# Patient Record
Sex: Female | Born: 1955 | Race: White | Hispanic: No | Smoking: Former smoker
Health system: Southern US, Community
[De-identification: ages and names within clinical notes are randomized; demographics above are authoritative.]

## PROBLEM LIST (undated history)

## (undated) DIAGNOSIS — I10 Essential (primary) hypertension: Secondary | ICD-10-CM

## (undated) DIAGNOSIS — M1990 Unspecified osteoarthritis, unspecified site: Secondary | ICD-10-CM

## (undated) DIAGNOSIS — J449 Chronic obstructive pulmonary disease, unspecified: Secondary | ICD-10-CM

## (undated) HISTORY — PX: ABDOMINAL HYSTERECTOMY: SHX81

## (undated) HISTORY — PX: LEEP: SHX91

---

## 2013-12-14 ENCOUNTER — Other Ambulatory Visit (HOSPITAL_COMMUNITY): Payer: Self-pay | Admitting: Family Medicine

## 2013-12-14 ENCOUNTER — Ambulatory Visit (HOSPITAL_COMMUNITY)
Admission: RE | Admit: 2013-12-14 | Discharge: 2013-12-14 | Disposition: A | Discharge status: Home / Self Care | Payer: 59 | Source: Ambulatory Visit | Attending: Family Medicine | Admitting: Family Medicine

## 2013-12-14 DIAGNOSIS — J3489 Other specified disorders of nose and nasal sinuses: Secondary | ICD-10-CM | POA: Insufficient documentation

## 2013-12-14 DIAGNOSIS — R509 Fever, unspecified: Secondary | ICD-10-CM | POA: Insufficient documentation

## 2013-12-14 DIAGNOSIS — R059 Cough, unspecified: Secondary | ICD-10-CM | POA: Insufficient documentation

## 2013-12-14 DIAGNOSIS — J209 Acute bronchitis, unspecified: Secondary | ICD-10-CM

## 2013-12-14 DIAGNOSIS — R05 Cough: Secondary | ICD-10-CM | POA: Insufficient documentation

## 2013-12-14 DIAGNOSIS — F17200 Nicotine dependence, unspecified, uncomplicated: Secondary | ICD-10-CM | POA: Insufficient documentation

## 2013-12-18 ENCOUNTER — Emergency Department (HOSPITAL_COMMUNITY): Payer: 59

## 2013-12-18 ENCOUNTER — Encounter (HOSPITAL_COMMUNITY): Payer: Self-pay | Admitting: Emergency Medicine

## 2013-12-18 ENCOUNTER — Inpatient Hospital Stay (HOSPITAL_COMMUNITY)
Admission: EM | Admit: 2013-12-18 | Discharge: 2013-12-20 | DRG: 190 | Disposition: A | Discharge status: Home / Self Care | Payer: 59 | Attending: Internal Medicine | Admitting: Internal Medicine

## 2013-12-18 DIAGNOSIS — J9600 Acute respiratory failure, unspecified whether with hypoxia or hypercapnia: Secondary | ICD-10-CM | POA: Diagnosis present

## 2013-12-18 DIAGNOSIS — J441 Chronic obstructive pulmonary disease with (acute) exacerbation: Principal | ICD-10-CM | POA: Diagnosis present

## 2013-12-18 DIAGNOSIS — Z23 Encounter for immunization: Secondary | ICD-10-CM

## 2013-12-18 DIAGNOSIS — I10 Essential (primary) hypertension: Secondary | ICD-10-CM | POA: Diagnosis present

## 2013-12-18 DIAGNOSIS — Z681 Body mass index (BMI) 19 or less, adult: Secondary | ICD-10-CM

## 2013-12-18 DIAGNOSIS — E43 Unspecified severe protein-calorie malnutrition: Secondary | ICD-10-CM | POA: Diagnosis present

## 2013-12-18 DIAGNOSIS — J449 Chronic obstructive pulmonary disease, unspecified: Secondary | ICD-10-CM | POA: Diagnosis present

## 2013-12-18 DIAGNOSIS — R0902 Hypoxemia: Secondary | ICD-10-CM

## 2013-12-18 DIAGNOSIS — F17200 Nicotine dependence, unspecified, uncomplicated: Secondary | ICD-10-CM | POA: Diagnosis present

## 2013-12-18 HISTORY — DX: Chronic obstructive pulmonary disease, unspecified: J44.9

## 2013-12-18 HISTORY — DX: Essential (primary) hypertension: I10

## 2013-12-18 LAB — CBC WITH DIFFERENTIAL/PLATELET
Basophils Absolute: 0 10*3/uL (ref 0.0–0.1)
Basophils Relative: 0 % (ref 0–1)
EOS ABS: 0.2 10*3/uL (ref 0.0–0.7)
Eosinophils Relative: 3 % (ref 0–5)
HEMATOCRIT: 43.6 % (ref 36.0–46.0)
Hemoglobin: 13.9 g/dL (ref 12.0–15.0)
LYMPHS PCT: 19 % (ref 12–46)
Lymphs Abs: 1 10*3/uL (ref 0.7–4.0)
MCH: 30.8 pg (ref 26.0–34.0)
MCHC: 31.9 g/dL (ref 30.0–36.0)
MCV: 96.5 fL (ref 78.0–100.0)
MONO ABS: 0.4 10*3/uL (ref 0.1–1.0)
Monocytes Relative: 7 % (ref 3–12)
Neutro Abs: 3.8 10*3/uL (ref 1.7–7.7)
Neutrophils Relative %: 71 % (ref 43–77)
PLATELETS: 360 10*3/uL (ref 150–400)
RBC: 4.52 MIL/uL (ref 3.87–5.11)
RDW: 13.1 % (ref 11.5–15.5)
WBC: 5.3 10*3/uL (ref 4.0–10.5)

## 2013-12-18 LAB — BASIC METABOLIC PANEL
BUN: 10 mg/dL (ref 6–23)
CALCIUM: 10.8 mg/dL — AB (ref 8.4–10.5)
CHLORIDE: 97 meq/L (ref 96–112)
CO2: 33 meq/L — AB (ref 19–32)
Creatinine, Ser: 0.48 mg/dL — ABNORMAL LOW (ref 0.50–1.10)
GFR calc Af Amer: >90 mL/min (ref >90)
GFR calc non Af Amer: >90 mL/min (ref >90)
Glucose, Bld: 105 mg/dL — ABNORMAL HIGH (ref 70–99)
Potassium: 4.8 mEq/L (ref 3.7–5.3)
Sodium: 142 mEq/L (ref 137–147)

## 2013-12-18 LAB — PRO B NATRIURETIC PEPTIDE: PRO B NATRI PEPTIDE: 119.1 pg/mL (ref 0–125)

## 2013-12-18 LAB — TROPONIN I: Troponin I: <0.3 ng/mL (ref <0.30)

## 2013-12-18 LAB — D-DIMER, QUANTITATIVE: D-Dimer, Quant: <0.27 ug/mL-FEU (ref 0.00–0.48)

## 2013-12-18 IMAGING — CR DG CHEST 2V
2 series · 2 of 2 positions shown · non-contrast
Comparison: [DATE]

CLINICAL DATA: Shortness of breath.

EXAM:
CHEST  2 VIEW

[view not recorded (1 of 2)]
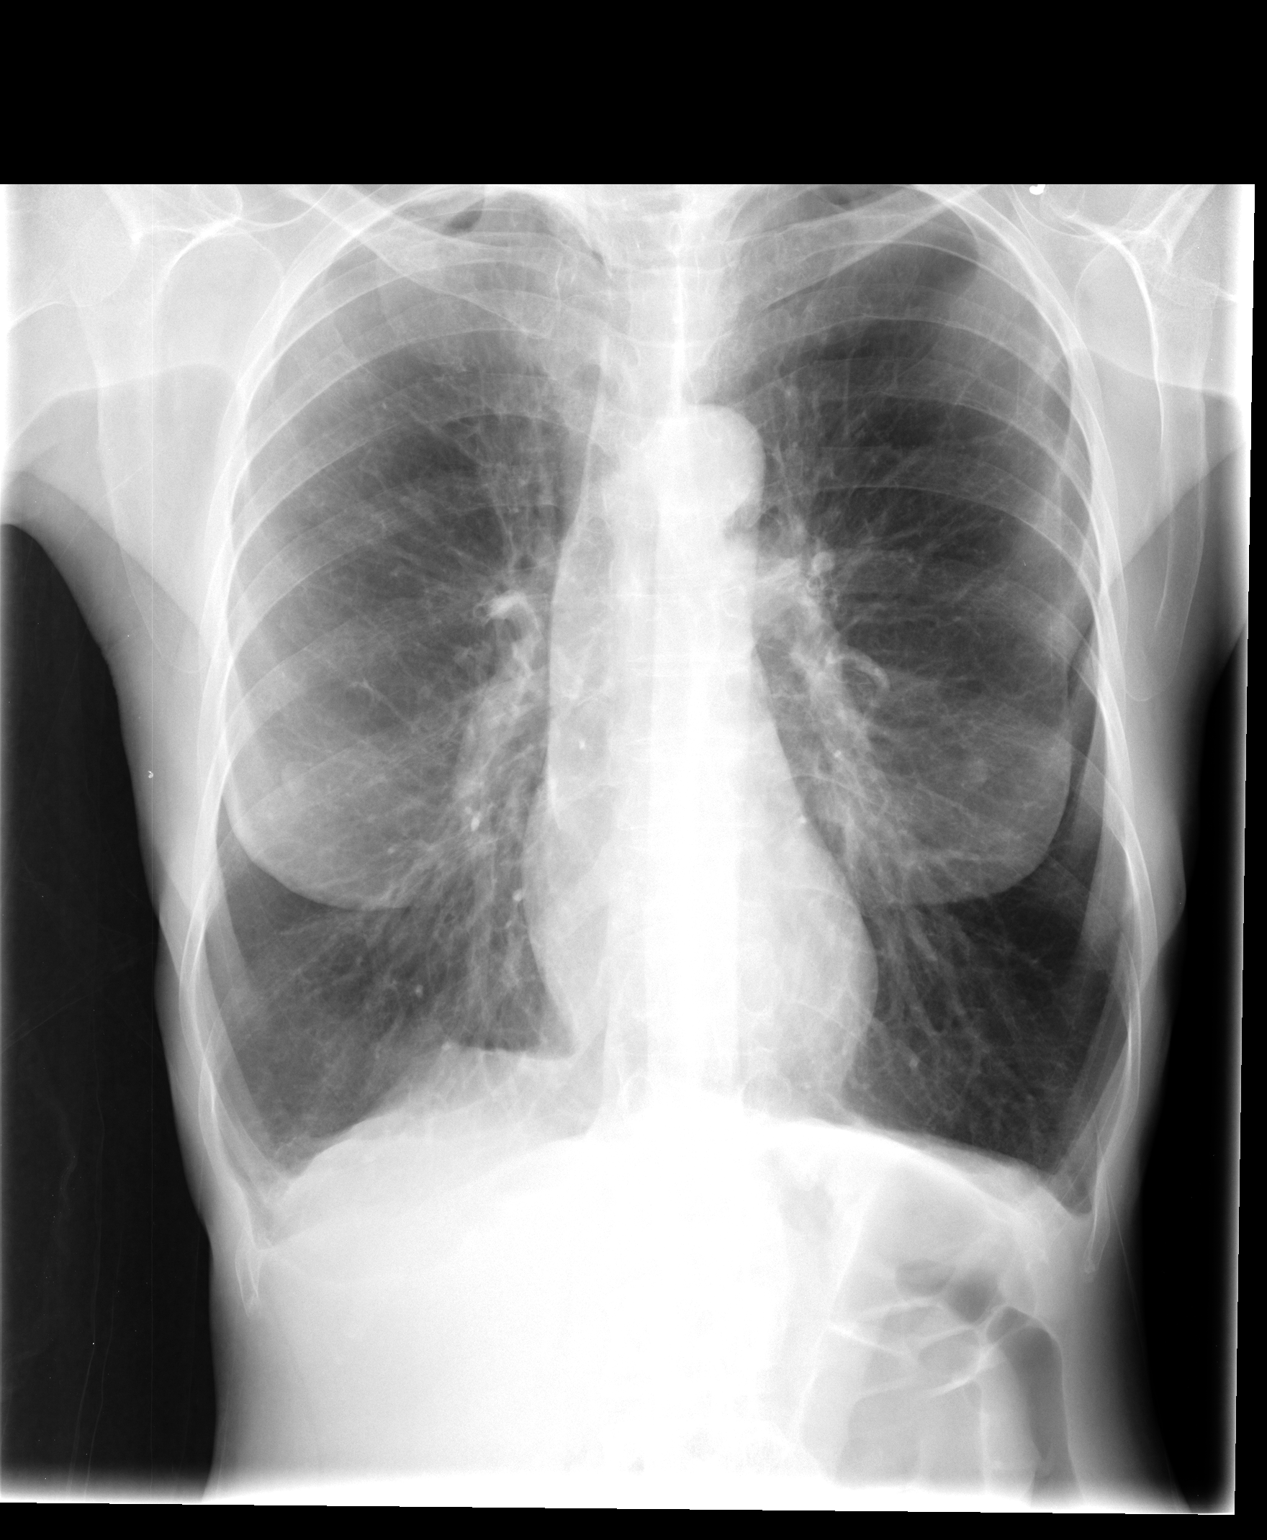

[view not recorded (2 of 2)]
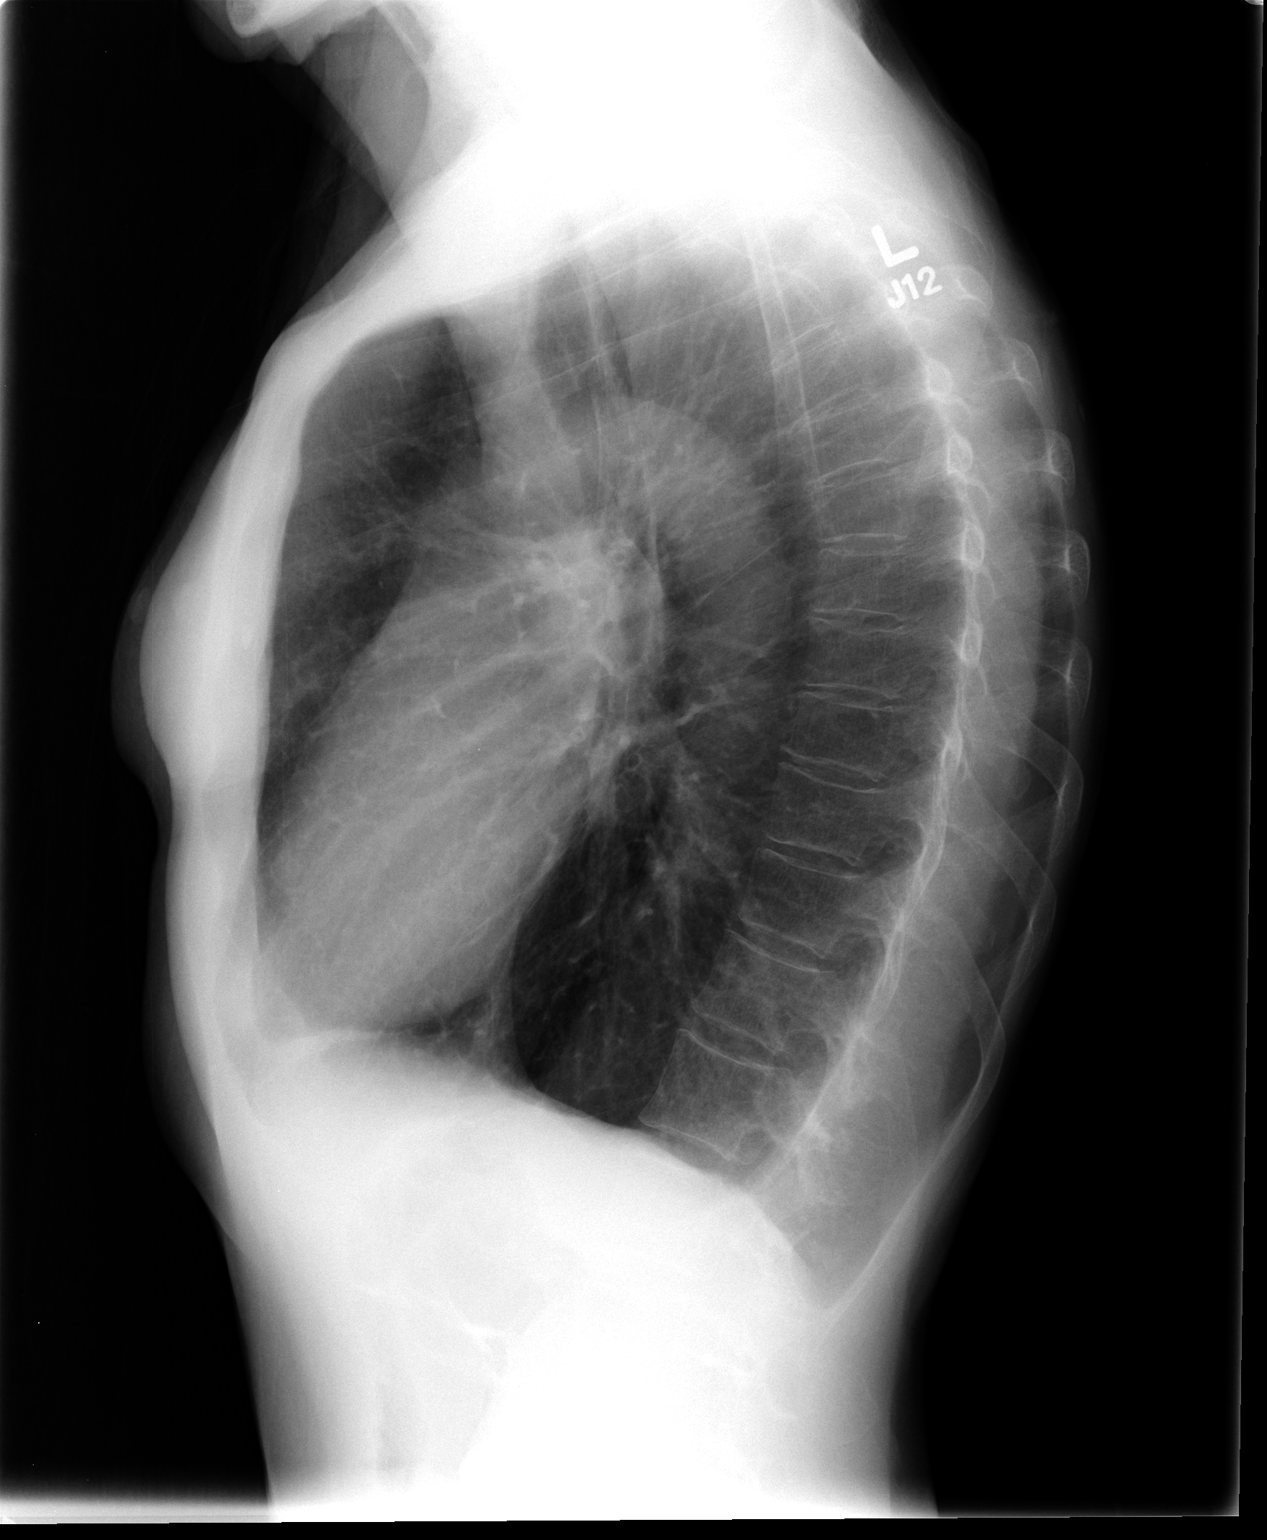

[2 of 2 positions shown; findings below may reference images not displayed]

FINDINGS: Severe hyperinflation/COPD changes. Heart and mediastinal contours
are within normal limits. No focal opacities or effusions. No acute
bony abnormality.
IMPRESSION: Severe COPD.  No active disease.

## 2013-12-18 MED ORDER — ALBUTEROL SULFATE (2.5 MG/3ML) 0.083% IN NEBU: 2.5000 mg | INHALATION_SOLUTION | RESPIRATORY_TRACT | Status: DC | PRN

## 2013-12-18 MED ORDER — ALPRAZOLAM 1 MG PO TABS: 1.0000 mg | ORAL_TABLET | Freq: Four times a day (QID) | ORAL | Status: DC | PRN

## 2013-12-18 MED ORDER — IPRATROPIUM BROMIDE 0.02 % IN SOLN: 0.5000 mg | Freq: Four times a day (QID) | RESPIRATORY_TRACT | Status: DC

## 2013-12-18 MED ORDER — BIOTENE DRY MOUTH MT LIQD
15.0000 mL | Freq: Two times a day (BID) | OROMUCOSAL | Status: DC
  Administered 2013-12-18 – 2013-12-20 (×4): 15 mL via OROMUCOSAL

## 2013-12-18 MED ORDER — DM-GUAIFENESIN ER 30-600 MG PO TB12
1.0000 | ORAL_TABLET | Freq: Two times a day (BID) | ORAL | Status: DC
  Administered 2013-12-18 – 2013-12-20 (×4): 1 via ORAL
  Filled 2013-12-18 (×4): qty 1

## 2013-12-18 MED ORDER — ENOXAPARIN SODIUM 30 MG/0.3ML ~~LOC~~ SOLN
30.0000 mg | SUBCUTANEOUS | Status: DC
  Administered 2013-12-19: 30 mg via SUBCUTANEOUS
  Filled 2013-12-18 (×2): qty 0.3

## 2013-12-18 MED ORDER — BUDESONIDE-FORMOTEROL FUMARATE 160-4.5 MCG/ACT IN AERO
2.0000 | INHALATION_SPRAY | Freq: Two times a day (BID) | RESPIRATORY_TRACT | Status: DC
  Administered 2013-12-19 – 2013-12-20 (×3): 2 via RESPIRATORY_TRACT
  Filled 2013-12-18: qty 6

## 2013-12-18 MED ORDER — ONDANSETRON HCL 4 MG/2ML IJ SOLN: 4.0000 mg | Freq: Four times a day (QID) | INTRAMUSCULAR | Status: DC | PRN

## 2013-12-18 MED ORDER — ONDANSETRON HCL 4 MG PO TABS: 4.0000 mg | ORAL_TABLET | Freq: Four times a day (QID) | ORAL | Status: DC | PRN

## 2013-12-18 MED ORDER — IPRATROPIUM-ALBUTEROL 0.5-2.5 (3) MG/3ML IN SOLN
3.0000 mL | Freq: Once | RESPIRATORY_TRACT | Status: AC
  Administered 2013-12-18: 3 mL via RESPIRATORY_TRACT
  Filled 2013-12-18: qty 3

## 2013-12-18 MED ORDER — IPRATROPIUM-ALBUTEROL 0.5-2.5 (3) MG/3ML IN SOLN
3.0000 mL | Freq: Four times a day (QID) | RESPIRATORY_TRACT | Status: DC
  Administered 2013-12-18 – 2013-12-19 (×3): 3 mL via RESPIRATORY_TRACT
  Filled 2013-12-18 (×3): qty 3

## 2013-12-18 MED ORDER — HYDRALAZINE HCL 25 MG PO TABS: 25.0000 mg | ORAL_TABLET | Freq: Four times a day (QID) | ORAL | Status: DC | PRN

## 2013-12-18 MED ORDER — METHYLPREDNISOLONE SODIUM SUCC 125 MG IJ SOLR
60.0000 mg | Freq: Four times a day (QID) | INTRAMUSCULAR | Status: DC
  Administered 2013-12-18 – 2013-12-20 (×7): 60 mg via INTRAVENOUS
  Filled 2013-12-18 (×8): qty 2

## 2013-12-18 MED ORDER — LEVOFLOXACIN IN D5W 250 MG/50ML IV SOLN
250.0000 mg | INTRAVENOUS | Status: DC
  Administered 2013-12-19: 250 mg via INTRAVENOUS
  Filled 2013-12-18: qty 50

## 2013-12-18 MED ORDER — ALBUTEROL SULFATE (2.5 MG/3ML) 0.083% IN NEBU: 2.5000 mg | INHALATION_SOLUTION | Freq: Four times a day (QID) | RESPIRATORY_TRACT | Status: DC

## 2013-12-18 MED ORDER — FUROSEMIDE 20 MG PO TABS
20.0000 mg | ORAL_TABLET | Freq: Every day | ORAL | Status: DC
  Administered 2013-12-19: 20 mg via ORAL
  Filled 2013-12-18 (×2): qty 1

## 2013-12-18 MED ORDER — PNEUMOCOCCAL VAC POLYVALENT 25 MCG/0.5ML IJ INJ
0.5000 mL | INJECTION | INTRAMUSCULAR | Status: DC
  Filled 2013-12-18: qty 0.5

## 2013-12-18 MED ORDER — PREDNISONE 50 MG PO TABS
60.0000 mg | ORAL_TABLET | Freq: Once | ORAL | Status: AC
  Administered 2013-12-18: 60 mg via ORAL
  Filled 2013-12-18 (×2): qty 1

## 2013-12-18 MED ORDER — LEVOFLOXACIN IN D5W 500 MG/100ML IV SOLN
500.0000 mg | Freq: Once | INTRAVENOUS | Status: AC
  Administered 2013-12-18: 500 mg via INTRAVENOUS
  Filled 2013-12-18: qty 100

## 2013-12-18 NOTE — H&P (Signed)
PCP:   Purvis Kilts, MD   Chief Complaint:  Shortness of breath  HPI:  58 year old female with history of hypertension, recently started on Losartan by her primary care provider, came to the ED with worsening shortness of breath which started 4 days ago. Patient saw her PCP 4 days ago for worsening shortness of breath, at that time patient was started on Levaquin and Symbicort. Before that patient also had seen Dr. Orson Ape who prescribed her Augmentin and prednisone. Patient did not feel better so she came to the hospital today. She has been smoking for 30 years, did not have to use inhalers at home. She did not have PFTs to  Diagnose COPD or emphysema. Patient also had fevers 3 days ago with MAXIMUM TEMPERATURE 101. She denies nausea vomiting or diarrhea, denies dysuria. She denies chest pain, no hemoptysis, she complains of white-colored phlegm with coughing. In the ED patient was given prednisone, Levaquin and DuoNeb nebulizers. Her breathing improved but O2 sats drop in 80's  on ambulation.  Allergies:  No Known Allergies    Past Medical History  Diagnosis Date  . COPD (chronic obstructive pulmonary disease)     Past Surgical History  Procedure Laterality Date  . Abdominal hysterectomy      Prior to Admission medications   Medication Sig Start Date End Date Taking? Authorizing Provider  albuterol (PROVENTIL HFA;VENTOLIN HFA) 108 (90 BASE) MCG/ACT inhaler Inhale 1 puff into the lungs 4 (four) times daily as needed for wheezing or shortness of breath.   Yes Historical Provider, MD  ALPRAZolam Duanne Moron) 1 MG tablet Take 1 mg by mouth every 6 (six) hours as needed for anxiety.   Yes Historical Provider, MD  budesonide-formoterol (SYMBICORT) 160-4.5 MCG/ACT inhaler Inhale 2 puffs into the lungs 2 (two) times daily.   Yes Historical Provider, MD  ibuprofen (ADVIL,MOTRIN) 200 MG tablet Take 400 mg by mouth every 6 (six) hours as needed for moderate pain.   Yes Historical Provider,  MD  levofloxacin (LEVAQUIN) 500 MG tablet Take 500 mg by mouth daily.   Yes Historical Provider, MD    Social History:  reports that she quit smoking 7 days ago. She does not have any smokeless tobacco history on file. She reports that she does not drink alcohol or use illicit drugs.  Family history- patient's father had heart problems  All the positives are listed in BOLD  Review of Systems:  HEENT: Headache, blurred vision, runny nose, sore throat Neck: Hypothyroidism, hyperthyroidism,,lymphadenopathy Chest : Shortness of breath, history of COPD, Asthma Heart : Chest pain, history of coronary arterey disease GI:  Nausea, vomiting, diarrhea, constipation, GERD GU: Dysuria, urgency, frequency of urination, hematuria Neuro: Stroke, seizures, syncope Psych: Depression, anxiety, hallucinations   Physical Exam: Blood pressure 119/52, pulse 111, temperature 98.4 F (36.9 C), temperature source Oral, resp. rate 16, height _0  (1.549 m), weight 37.649 kg (83 lb), SpO2 96.00%. Constitutional:   Patient is malnourished-appearing female* in no acute distress and cooperative with exam. Head: Normocephalic and atraumatic Mouth: Mucus membranes moist Eyes: PERRL, EOMI, conjunctivae normal Neck: Supple, No Thyromegaly Cardiovascular: RRR, S1 normal, S2 normal Pulmonary/Chest: Bibasilar crackles Abdominal: Soft. Non-tender, non-distended, bowel sounds are normal, no masses, organomegaly, or guarding present.  Neurological: A&O x3, Strenght is normal and symmetric bilaterally, cranial nerve II-XII are grossly intact, no focal motor deficit, sensory intact to light touch bilaterally.  Extremities : No Cyanosis, Clubbing or Edema   Labs on Admission:  Results for orders placed during  the hospital encounter of 12/18/13 (from the past 48 hour(s))  D-DIMER, QUANTITATIVE     Status: None   Collection Time    12/18/13  1:11 PM      Result Value Ref Range   D-Dimer, Quant <0.27  0.00 - 0.48  ug/mL-FEU   Comment:            AT THE INHOUSE ESTABLISHED CUTOFF     VALUE OF 0.48 ug/mL FEU,     THIS ASSAY HAS BEEN DOCUMENTED     IN THE LITERATURE TO HAVE     A SENSITIVITY AND NEGATIVE     PREDICTIVE VALUE OF AT LEAST     98 TO 99%.  THE TEST RESULT     SHOULD BE CORRELATED WITH     AN ASSESSMENT OF THE CLINICAL     PROBABILITY OF DVT / VTE.  TROPONIN I     Status: None   Collection Time    12/18/13  1:11 PM      Result Value Ref Range   Troponin I <0.30  <0.30 ng/mL   Comment:            Due to the release kinetics of cTnI,     a negative result within the first hours     of the onset of symptoms does not rule out     myocardial infarction with certainty.     If myocardial infarction is still suspected,     repeat the test at appropriate intervals.  BASIC METABOLIC PANEL     Status: Abnormal   Collection Time    12/18/13  1:15 PM      Result Value Ref Range   Sodium 142  137 - 147 mEq/L   Potassium 4.8  3.7 - 5.3 mEq/L   Chloride 97  96 - 112 mEq/L   CO2 33 (*) 19 - 32 mEq/L   Glucose, Bld 105 (*) 70 - 99 mg/dL   BUN 10  6 - 23 mg/dL   Creatinine, Ser 0.48 (*) 0.50 - 1.10 mg/dL   Calcium 10.8 (*) 8.4 - 10.5 mg/dL   GFR calc non Af Amer >90  >90 mL/min   GFR calc Af Amer >90  >90 mL/min   Comment: (NOTE)     The eGFR has been calculated using the CKD EPI equation.     This calculation has not been validated in all clinical situations.     eGFR's persistently <90 mL/min signify possible Chronic Kidney     Disease.  CBC WITH DIFFERENTIAL     Status: None   Collection Time    12/18/13  1:15 PM      Result Value Ref Range   WBC 5.3  4.0 - 10.5 K/uL   RBC 4.52  3.87 - 5.11 MIL/uL   Hemoglobin 13.9  12.0 - 15.0 g/dL   HCT 43.6  36.0 - 46.0 %   MCV 96.5  78.0 - 100.0 fL   MCH 30.8  26.0 - 34.0 pg   MCHC 31.9  30.0 - 36.0 g/dL   RDW 13.1  11.5 - 15.5 %   Platelets 360  150 - 400 K/uL   Neutrophils Relative % 71  43 - 77 %   Neutro Abs 3.8  1.7 - 7.7 K/uL    Lymphocytes Relative 19  12 - 46 %   Lymphs Abs 1.0  0.7 - 4.0 K/uL   Monocytes Relative 7  3 - 12 %   Monocytes  Absolute 0.4  0.1 - 1.0 K/uL   Eosinophils Relative 3  0 - 5 %   Eosinophils Absolute 0.2  0.0 - 0.7 K/uL   Basophils Relative 0  0 - 1 %   Basophils Absolute 0.0  0.0 - 0.1 K/uL    Radiological Exams on Admission: Dg Chest 2 View  12/18/2013   CLINICAL DATA:  Shortness of breath.  EXAM: CHEST  2 VIEW  COMPARISON:  12/14/2013  FINDINGS: Severe hyperinflation/COPD changes. Heart and mediastinal contours are within normal limits. No focal opacities or effusions. No acute bony abnormality.  IMPRESSION: Severe COPD.  No active disease.   Electronically Signed   By: Rolm Baptise M.D.   On: 12/18/2013 13:44    Assessment/Plan Active Problems:   COPD exacerbation   HTN (hypertension)  COPD exacerbation Patient probably has COPD exacerbation, will start her on Sulamyd roll 60 mg IV every 6 hours, DuoNeb nebulizers, Mucinex DM one tablet by mouth twice a day, Levaquin per pharmacy consultation.  Hypertension Patient has untreated hypertension, will hold losartan at this time blood pressure is down to 110/56. We'll start hydralazine 25 mg by mouth every 6 hours when necessary for BP greater than 160/100.  ? Hypertensive heart disease Will obtain BNP and 2-D echocardiogram. And also start Lasix 20 mg by mouth daily as patient did have bilateral crackles on auscultation, concerning for diastolic dysfunction.  DVT prophylaxis Lovenox  Code status: Patient is full code  Family discussion: Discussed with patient's daughter at bedside   Time Spent on Admission: 64 min  [facility] Pager: [phone] 12/18/2013, 4:13 PM  If 7PM-7AM, please contact night-coverage  [email or web address]  Password [email or web address]

## 2013-12-18 NOTE — ED Provider Notes (Signed)
CSN: [id number]     Arrival date & time 12/18/13  1202 History  This chart was scribed for Glynn OctaveStephen Amous Crewe, MD by Beverly MilchJ Harrison Collins, ED Scribe. This patient was seen in room APA11/APA11 and the patient's care was started at 2:07 PM.    Chief Complaint  Patient presents with  . Shortness of Breath      HPI HPI Comments: Natasha Farrieradine U [name] is a 58 y.o. female who presents to the Emergency Department complaining of gradually worsening SOB that began 4 days ago. She states she saw her PCP 4 days ago and she was diagnosed with COPD and given Levaquin and symbicort. Before that visit, she reports seeing Dr. Regino SchultzeMcGough who prescribed her Augmentin and prednisone. Pt reports she's had an associated cough with bright yellow sputum. Pt denies change in appetite, chest pain, abdominal pain. She also reports laying down neither improves nor worsens her ability to breathe. She states her sister has had strep throat recently. Pt reports she's been a long time smoker but quit a week and a half ago.     Past Medical History  Diagnosis Date  . COPD (chronic obstructive pulmonary disease)    Past Surgical History  Procedure Laterality Date  . Abdominal hysterectomy     History reviewed. No pertinent family history. History  Substance Use Topics  . Smoking status: Former Smoker    Quit date: 12/11/2013  . Smokeless tobacco: Not on file  . Alcohol Use: No   OB History   Grav Para Term Preterm Abortions TAB SAB Ect Mult Living                 Review of Systems A complete 10 system review of systems was obtained and all systems are negative except as noted in the HPI and PMH.     Allergies  Review of patient's allergies indicates no known allergies.  Home Medications   Prior to Admission medications   Medication Sig Start Date End Date Taking? Authorizing Provider  albuterol (PROVENTIL HFA;VENTOLIN HFA) 108 (90 BASE) MCG/ACT inhaler Inhale 1 puff into the lungs 4 (four) times daily as needed for  wheezing or shortness of breath.   Yes Historical Provider, MD  ALPRAZolam Prudy Feeler(XANAX) 1 MG tablet Take 1 mg by mouth every 6 (six) hours as needed for anxiety.   Yes Historical Provider, MD  budesonide-formoterol (SYMBICORT) 160-4.5 MCG/ACT inhaler Inhale 2 puffs into the lungs 2 (two) times daily.   Yes Historical Provider, MD  ibuprofen (ADVIL,MOTRIN) 200 MG tablet Take 400 mg by mouth every 6 (six) hours as needed for moderate pain.   Yes Historical Provider, MD  levofloxacin (LEVAQUIN) 500 MG tablet Take 500 mg by mouth daily.   Yes Historical Provider, MD   Triage Vitals: BP 180/82  Pulse 115  Temp(Src) 98.4 F (36.9 C) (Oral)  Resp 20  Ht 5\' 1"  (1.549 m)  Wt 83 lb (37.649 kg)  BMI 15.69 kg/m2  SpO2 91%  Physical Exam  Constitutional: She is oriented to person, place, and time. She appears well-developed. No distress.  Speaking in full sentences  HENT:  Head: Normocephalic.  Eyes: Conjunctivae and EOM are normal. No scleral icterus.  Neck: Neck supple. No thyromegaly present.  Cardiovascular: Normal rate and regular rhythm.  Exam reveals no gallop and no friction rub.   No murmur heard. Pulmonary/Chest: No stridor. She has decreased breath sounds in the right upper field, the right middle field, the right lower field, the left upper field, the  left middle field and the left lower field. She has no wheezes. She has no rales. She exhibits no tenderness.  No expiratory wheezing  Abdominal: She exhibits no distension. There is no tenderness. There is no rebound.  Musculoskeletal: Normal range of motion. She exhibits no edema.  Lymphadenopathy:    She has no cervical adenopathy.  Neurological: She is oriented to person, place, and time. She exhibits normal muscle tone. Coordination normal.  Skin: No rash noted. No erythema.  Psychiatric: She has a normal mood and affect. Her behavior is normal.    ED Course  Procedures (including critical care time)  DIAGNOSTIC STUDIES: Oxygen  Saturation is 91% on [place], low by my interpretation.    COORDINATION OF CARE: 2:14 PM- Pt advised of plan for treatment and pt agrees.  3:08 PM - Discussed labs and potential for needing Oxygen at home. Will observe walking O2 sats.     Labs Review Labs Reviewed  BASIC METABOLIC PANEL - Abnormal; Notable for the following:    CO2 33 (*)    Glucose, Bld 105 (*)    Creatinine, Ser 0.48 (*)    Calcium 10.8 (*)    All other components within normal limits  CBC WITH DIFFERENTIAL  D-DIMER, QUANTITATIVE  TROPONIN I  PRO B NATRIURETIC PEPTIDE    Imaging Review Dg Chest 2 View  12/18/2013   CLINICAL DATA:  Shortness of breath.  EXAM: CHEST  2 VIEW  COMPARISON:  12/14/2013  FINDINGS: Severe hyperinflation/COPD changes. Heart and mediastinal contours are within normal limits. No focal opacities or effusions. No acute bony abnormality.  IMPRESSION: Severe COPD.  No active disease.   Electronically Signed   By: Charlett NoseKevin  Dover M.D.   On: 12/18/2013 13:44     EKG Interpretation   Date/Time:  Monday December 18 2013 14:31:25 EDT Ventricular Rate:  102 PR Interval:  112 QRS Duration: 72 QT Interval:  348 QTC Calculation: 453 R Axis:   90 Text Interpretation:  Sinus tachycardia Right atrial enlargement Rightward  axis Pulmonary disease pattern Abnormal ECG No previous ECGs available No  previous ECGs available Confirmed by Manus GunningANCOUR  MD, Jabreel Chimento [phone]([id number]) on  12/18/2013 2:40:41 PM      MDM   Final diagnoses:  COPD exacerbation  Hypoxia   Patient with cough, shortness of breath, recent diagnosis of COPD presenting with worsening shortness of breath and hypoxia. Denies chest pain. Cough is productive of white sputum. She is currently taking Levaquin. Denies having any history of COPD before this week.  No Distress. Speaking full sentences. Decreased breath sounds throughout.  Chest x-ray with severe changes of COPD. No infiltrate. D-dimer negative. No change in decreased breath sounds after  nebulizer and steroids.  Suspect COPD exacerbation. Patient given nebulizers, steroids and Levaquin. We'll check a BNP to rule out diastolic dysfunction. Her oxygen level drops to 84% with ambulation and she'll require admission. Discussed with Dr. Sharl MaLama.   BP 119/52  Pulse 111  Temp(Src) 98.4 F (36.9 C) (Oral)  Resp 16  Ht 5\' 1"  (1.549 m)  Wt 83 lb (37.649 kg)  BMI 15.69 kg/m2  SpO2 96%   I personally performed the services described in this documentation, which was scribed in my presence. The recorded information has been reviewed and is accurate.   Glynn OctaveStephen Kimberl Vig, MD 12/18/13 [phone]

## 2013-12-18 NOTE — Progress Notes (Signed)
ANTIBIOTIC CONSULT NOTE - INITIAL  Pharmacy Consult for Levaquin Indication: bronchitis  No Known Allergies  Patient Measurements: Height: 5\' 1"  (154.9 cm) Weight: 75 lb 13.4 oz (34.4 kg) IBW/kg (Calculated) : 47.8  Vital Signs: Temp: 98.1 F (36.7 C) (04/20 1750) Temp src: Oral (04/20 1750) BP: 126/64 mmHg (04/20 1750) Pulse Rate: 88 (04/20 1750) Intake/Output from previous day:   Intake/Output from this shift:    Labs:  Recent Labs  12/18/13 1315  WBC 5.3  HGB 13.9  PLT 360  CREATININE 0.48*   Estimated Creatinine Clearance: 41.6 ml/min (by C-G formula based on Cr of 0.48). No results found for this basename: VANCOTROUGH, VANCOPEAK, VANCORANDOM, GENTTROUGH, GENTPEAK, GENTRANDOM, TOBRATROUGH, TOBRAPEAK, TOBRARND, AMIKACINPEAK, AMIKACINTROU, AMIKACIN,  in the last 72 hours   Microbiology: No results found for this or any previous visit (from the past 720 hour(s)).  Medical History: Past Medical History  Diagnosis Date  . COPD (chronic obstructive pulmonary disease)     Medications:  Scheduled:  . antiseptic oral rinse  15 mL Mouth Rinse BID  . budesonide-formoterol  2 puff Inhalation BID  . dextromethorphan-guaiFENesin  1 tablet Oral BID  . enoxaparin (LOVENOX) injection  30 mg Subcutaneous Q24H  . furosemide  20 mg Oral Daily  . ipratropium-albuterol  3 mL Nebulization Q6H  . [START ON 12/19/2013] levofloxacin (LEVAQUIN) IV  250 mg Intravenous Q24H  . methylPREDNISolone (SOLU-MEDROL) injection  60 mg Intravenous 4 times per day  . [START ON 12/19/2013] pneumococcal 23 valent vaccine  0.5 mL Intramuscular Tomorrow-1000   Assessment: 58 yo F admitted with worsening shortness of breath & reported fever a couple days ago.  She is starting empirically on Levaquin for bronchitis/COPD exacerbation.   Renal function is at patient's baseline. Levaquin 500mg  IV given in ED.  Levaquin 4/20>>  Goal of Therapy:  Eradicate infection.  Plan:  Levaquin 250mg  IV q24h-  starting tomorrow. Monitor renal function and cx data  Duration of therapy per MD.  Change to oral abx once appropriate.   Mercy RidingAndrea Michelle Darrow Barreiro 12/18/2013,7:00 PM

## 2013-12-18 NOTE — ED Notes (Signed)
Sob, for 4 days,  Seen by Dr Phillips OdorGolding on Thursday, Has  Finished augmentin  Course and now taking levaquin.     White sputum.

## 2013-12-18 NOTE — ED Notes (Signed)
Pt walked to restroom on RA with O2 sat dropping to 84%

## 2013-12-19 DIAGNOSIS — J9600 Acute respiratory failure, unspecified whether with hypoxia or hypercapnia: Secondary | ICD-10-CM

## 2013-12-19 DIAGNOSIS — I369 Nonrheumatic tricuspid valve disorder, unspecified: Secondary | ICD-10-CM

## 2013-12-19 DIAGNOSIS — E43 Unspecified severe protein-calorie malnutrition: Secondary | ICD-10-CM | POA: Diagnosis present

## 2013-12-19 LAB — CBC
HCT: 39.3 % (ref 36.0–46.0)
Hemoglobin: 12.8 g/dL (ref 12.0–15.0)
MCH: 31.1 pg (ref 26.0–34.0)
MCHC: 32.6 g/dL (ref 30.0–36.0)
MCV: 95.4 fL (ref 78.0–100.0)
PLATELETS: 336 10*3/uL (ref 150–400)
RBC: 4.12 MIL/uL (ref 3.87–5.11)
RDW: 13.2 % (ref 11.5–15.5)
WBC: 4.4 10*3/uL (ref 4.0–10.5)

## 2013-12-19 LAB — COMPREHENSIVE METABOLIC PANEL
ALBUMIN: 3.4 g/dL — AB (ref 3.5–5.2)
ALT: 21 U/L (ref 0–35)
AST: 19 U/L (ref 0–37)
Alkaline Phosphatase: 66 U/L (ref 39–117)
BUN: 14 mg/dL (ref 6–23)
CALCIUM: 9.5 mg/dL (ref 8.4–10.5)
CO2: 31 mEq/L (ref 19–32)
Chloride: 99 mEq/L (ref 96–112)
Creatinine, Ser: 0.47 mg/dL — ABNORMAL LOW (ref 0.50–1.10)
GFR calc Af Amer: >90 mL/min (ref >90)
GFR calc non Af Amer: >90 mL/min (ref >90)
Glucose, Bld: 138 mg/dL — ABNORMAL HIGH (ref 70–99)
Potassium: 3.9 mEq/L (ref 3.7–5.3)
SODIUM: 142 meq/L (ref 137–147)
Total Bilirubin: 0.2 mg/dL — ABNORMAL LOW (ref 0.3–1.2)
Total Protein: 6.8 g/dL (ref 6.0–8.3)

## 2013-12-19 LAB — TROPONIN I: Troponin I: <0.3 ng/mL (ref <0.30)

## 2013-12-19 MED ORDER — IPRATROPIUM BROMIDE 0.02 % IN SOLN
0.5000 mg | Freq: Four times a day (QID) | RESPIRATORY_TRACT | Status: DC
  Administered 2013-12-19 – 2013-12-20 (×5): 0.5 mg via RESPIRATORY_TRACT
  Filled 2013-12-19 (×5): qty 2.5

## 2013-12-19 MED ORDER — LEVALBUTEROL HCL 0.63 MG/3ML IN NEBU: 0.6300 mg | INHALATION_SOLUTION | RESPIRATORY_TRACT | Status: DC | PRN

## 2013-12-19 MED ORDER — ENSURE COMPLETE PO LIQD
237.0000 mL | Freq: Two times a day (BID) | ORAL | Status: DC
  Administered 2013-12-19 – 2013-12-20 (×2): 237 mL via ORAL

## 2013-12-19 MED ORDER — GUAIFENESIN ER 600 MG PO TB12
1200.0000 mg | ORAL_TABLET | Freq: Two times a day (BID) | ORAL | Status: DC
  Administered 2013-12-19 – 2013-12-20 (×2): 1200 mg via ORAL
  Filled 2013-12-19 (×2): qty 2

## 2013-12-19 MED ORDER — LEVALBUTEROL HCL 0.63 MG/3ML IN NEBU
0.6300 mg | INHALATION_SOLUTION | Freq: Four times a day (QID) | RESPIRATORY_TRACT | Status: DC
  Administered 2013-12-19 – 2013-12-20 (×5): 0.63 mg via RESPIRATORY_TRACT
  Filled 2013-12-19 (×5): qty 3

## 2013-12-19 NOTE — Progress Notes (Signed)
INITIAL NUTRITION ASSESSMENT  DOCUMENTATION CODES Per approved criteria  -Severe malnutrition in the context of chronic illness   Pt meets criteria for severe MALNUTRITION in the context of chronic illness as evidenced by <75% estimated energy intake x 1 month, severe fat and muscle depletion, and 9.6% wt loss x 2 weeks.  INTERVENTION: Ensure Complete po BID, each supplement provides 350 kcal and 13 grams of protein. MVI daily  NUTRITION DIAGNOSIS: Inadequate oral intake related to decreased appetite as evidenced by PO: 50%, severe muscle and fat wasting, 9.6% wt loss x 2 weeks.   Goal: Pt will meet >90% of estimated nutritional needs  Monitor:  PO intake, labs, skin assessments, weight changes, I/O's  Reason for Assessment: MST=2, consult to assess needs  58 y.o. female  Admitting Dx: <principal problem not specified>  ASSESSMENT: Pt admitted with COPD exacerbation. She reports being in her usual state of health approximately 2 weeks ago. Pt reports that she has always been petite. She reveals that UBW was 100# in 2009, prior to her husband's passing. She lost 20# during that time and "was never able to put it back on". She reports UBW of 82-83# since her husband's death. Per pt reports, she has lost approximately 9.6% of her UBW over the past 2 weeks. She reports her appetite is poor at baseline, but has been declining over the past two weeks. Documented meal intake reveals 50% of meal completion at breakfast. She eats a regular diet at home and reports she was drinking Ensure or Boost daily up until 2 weeks ago.   Pt has multiple diet related questions including home supplement use and vitamins. Encouraged pt to continue supplement use at home (supplement of either Ensure or Boost, per pt preference). Also encouraged Ensure Plus or Boost Plus for caloric density. Discussed potential of using MVI daily. Also educated pt on high protein, high calorie diet. Pt grateful for information  given.   Nutrition Focused Physical Exam:  Subcutaneous Fat:  Orbital Region: severe depletion Upper Arm Region: severe depletion Thoracic and Lumbar Region: severe depletion  Muscle:  Temple Region: severe depletion Clavicle Bone Region: severe depletion Clavicle and Acromion Bone Region: severe depletion Scapular Bone Region: severe depletion Dorsal Hand: severe depletion Patellar Region: severe depletion Anterior Thigh Region: severe depletion Posterior Calf Region: severe depletion  Edema: none present  Height: Ht Readings from Last 1 Encounters:  12/18/13 5\' 1"  (1.549 m)    Weight: Wt Readings from Last 1 Encounters:  12/18/13 75 lb 13.4 oz (34.4 kg)    Ideal Body Weight: 105#  % Ideal Body Weight: 71%  Wt Readings from Last 10 Encounters:  12/18/13 75 lb 13.4 oz (34.4 kg)    Usual Body Weight: 83#  % Usual Body Weight: 90%  BMI:  Body mass index is 14.34 kg/(m^2). Meets criteria for underweight.   Estimated Nutritional Needs: Kcal: [id number] daily Protein: 34-43 grams daily Fluid: 1.4-1.5 L daily  Skin: No skin issues noted  Diet Order: Cardiac  EDUCATION NEEDS: -Education needs addressed   Intake/Output Summary (Last 24 hours) at 12/19/13 1050 Last data filed at 12/18/13 2100  Gross per 24 hour  Intake    240 ml  Output    500 ml  Net   -260 ml    Last BM: 12/17/13  Labs:   Recent Labs Lab 12/18/13 1315 12/19/13 0453  NA 142 142  K 4.8 3.9  CL 97 99  CO2 33* 31  BUN 10 14  CREATININE  0.48* 0.47*  CALCIUM 10.8* 9.5  GLUCOSE 105* 138*    CBG (last 3)  No results found for this basename: GLUCAP,  in the last 72 hours  Scheduled Meds: . antiseptic oral rinse  15 mL Mouth Rinse BID  . budesonide-formoterol  2 puff Inhalation BID  . dextromethorphan-guaiFENesin  1 tablet Oral BID  . enoxaparin (LOVENOX) injection  30 mg Subcutaneous Q24H  . furosemide  20 mg Oral Daily  . ipratropium  0.5 mg Nebulization Q6H  .  levalbuterol  0.63 mg Nebulization Q6H  . levofloxacin (LEVAQUIN) IV  250 mg Intravenous Q24H  . methylPREDNISolone (SOLU-MEDROL) injection  60 mg Intravenous 4 times per day  . pneumococcal 23 valent vaccine  0.5 mL Intramuscular Tomorrow-1000    Continuous Infusions:   Past Medical History  Diagnosis Date  . COPD (chronic obstructive pulmonary disease)     Past Surgical History  Procedure Laterality Date  . Abdominal hysterectomy      Yahsir Wickens A. Mayford KnifeWilliams, RD, LDN Pager: [phone]

## 2013-12-19 NOTE — Progress Notes (Signed)
[facility] PROGRESS NOTE  Natasha [name] WGN:[id number] DOB: 07-09-1956 DOA: 12/18/2013 PCP: Natasha [name], Natasha CABOT, Natasha [name]  Assessment/Plan: 1. Acute respiratory failure. Related to COPD exacerbation. Continue to wean down oxygen as tolerated. Echocardiogram shows normal ejection fraction. Elevated pulmonary pressures are likely related to lung disease. 2. COPD exacerbation. Continue intravenous steroids, bronchodilators and antibiotics. Continue pulmonary hygiene. Flutter valve. 3. Hypertension. Patient was prescribed losartan as an outpatient but has not taken this today. She is currently normotensive. We'll continue to monitor.  Code Status: Full code Family Communication: Discussed with patient Disposition Plan: Discharge home once improved   Consultants:    Procedures: Echo: Procedure narrative: Transthoracic echocardiography. Image quality was poor. The study was technically difficult, as a result of poor acoustic windows and poor sound wave transmission. - Left ventricle: The cavity size was normal. Wall thickness was normal. Systolic function was normal. The estimated ejection fraction was in the range of 60% to 65%. The study is not technically sufficient to allow evaluation of LV diastolic function. - Pulmonary arteries: Systolic pressure was moderately increased. PA peak pressure: 43mm Hg (S).    Antibiotics: Levaquin 4/21  HPI/Subjective: Feeling better. Still short of breath. No wheezing.  Objective: Filed Vitals:   12/19/13 1436  BP:   Pulse: 119  Temp:   Resp: 18    Intake/Output Summary (Last 24 hours) at 12/19/13 1918 Last data filed at 12/19/13 1726  Gross per 24 hour  Intake    480 ml  Output   2300 ml  Net  -1820 ml   Filed Weights   12/18/13 1247 12/18/13 1750  Weight: 37.649 kg (83 lb) 34.4 kg (75 lb 13.4 oz)    Exam:   General:  NAD  Cardiovascular: S1, S2, tachycardic  Respiratory: Diminished breath sounds bilaterally, no  wheezing  Abdomen: Soft, nontender, positive bowel sounds  Musculoskeletal: No edema bilaterally   Data Reviewed: Basic Metabolic Panel:  Recent Labs Lab 12/18/13 1315 12/19/13 0453  NA 142 142  K 4.8 3.9  CL 97 99  CO2 33* 31  GLUCOSE 105* 138*  BUN 10 14  CREATININE 0.48* 0.47*  CALCIUM 10.8* 9.5   Liver Function Tests:  Recent Labs Lab 12/19/13 0453  AST 19  ALT 21  ALKPHOS 66  BILITOT 0.2*  PROT 6.8  ALBUMIN 3.4*   No results found for this basename: LIPASE, AMYLASE,  in the last 168 hours No results found for this basename: AMMONIA,  in the last 168 hours CBC:  Recent Labs Lab 12/18/13 1315 12/19/13 0453  WBC 5.3 4.4  NEUTROABS 3.8  --   HGB 13.9 12.8  HCT 43.6 39.3  MCV 96.5 95.4  PLT 360 336   Cardiac Enzymes:  Recent Labs Lab 12/18/13 1311 12/18/13 1810 12/18/13 2340 12/19/13 0453  TROPONINI <0.30 <0.30 <0.30 <0.30   BNP (last 3 results)  Recent Labs  12/18/13 1315  PROBNP 119.1   CBG: No results found for this basename: GLUCAP,  in the last 168 hours  No results found for this or any previous visit (from the past 240 hour(s)).   Studies: Dg Chest 2 View  12/18/2013   CLINICAL DATA:  Shortness of breath.  EXAM: CHEST  2 VIEW  COMPARISON:  12/14/2013  FINDINGS: Severe hyperinflation/COPD changes. Heart and mediastinal contours are within normal limits. No focal opacities or effusions. No acute bony abnormality.  IMPRESSION: Severe COPD.  No active disease.   Electronically Signed   By: Natasha NoseKevin  Dover M.D.  On: 12/18/2013 13:44    Scheduled Meds: . antiseptic oral rinse  15 mL Mouth Rinse BID  . budesonide-formoterol  2 puff Inhalation BID  . dextromethorphan-guaiFENesin  1 tablet Oral BID  . enoxaparin (LOVENOX) injection  30 mg Subcutaneous Q24H  . feeding supplement (ENSURE COMPLETE)  237 mL Oral BID BM  . furosemide  20 mg Oral Daily  . ipratropium  0.5 mg Nebulization Q6H  . levalbuterol  0.63 mg Nebulization Q6H  .  levofloxacin (LEVAQUIN) IV  250 mg Intravenous Q24H  . methylPREDNISolone (SOLU-MEDROL) injection  60 mg Intravenous 4 times per day  . pneumococcal 23 valent vaccine  0.5 mL Intramuscular Tomorrow-1000   Continuous Infusions:   Active Problems:   COPD exacerbation   HTN (hypertension)   Protein-calorie malnutrition, severe    Time spent: 25mins    [facility] Buffalo[facility]hanzeb Roxas [name]  [facility] Pager [phone]. If 7PM-7AM, please contact night-coverage at [email or web address], password [email or web address] 12/19/2013, 7:18 PM  LOS: 1 day

## 2013-12-19 NOTE — Progress Notes (Addendum)
Patient's heart rate on telemetry monitor 115-120.  Patient resting in bed.  2D echo currently in progress. Dr. Kerry HoughMemon notified.  Patient's medications change per Dr. Kerry HoughMemon.

## 2013-12-19 NOTE — Clinical Documentation Improvement (Signed)
INITIAL NUTRITION ASSESSMENT  12/19/2013  Pt meets criteria for severe MALNUTRITION in the context of chronic illness as evidenced by <75% estimated energy intake x 1 month, severe fat and muscle depletion, and 9.6% wt loss x 2 weeks.   Possible Clinical Conditions?  Severe Malnutrition   Protein Calorie Malnutrition Severe Protein Calorie Malnutrition Emaciation  Cachexia    Other Condition Cannot clinically determine  Supporting Information: H&P notes:"Constitutional: Patient is malnourished-appearing female"  Risk Factors:COPD exacerbation Signs & Symptoms:Inadequate oral intake related to decreased appetite as evidenced by PO: 50%, severe muscle and fat wasting, 9.6% wt loss x 2 weeks has lost approximately 9.6% of her UBW over the past 2 weeks.  She reports her appetite is poor at baseline, but has been declining over the past two weeks" Subcutaneous Fat:  Orbital Region: severe depletion  Upper Arm Region: severe depletion  Thoracic and Lumbar Region: severe depletion  Muscle:  Temple Region: severe depletion  Clavicle Bone Region: severe depletion  Clavicle and Acromion Bone Region: severe depletion  Scapular Bone Region: severe depletion  Dorsal Hand: severe depletion  Patellar Region: severe depletion  Anterior Thigh Region: severe depletion  Posterior Calf Region: severe depletion  Diagnostics:Height: 5\' 1"    Weight:  75 lbs.   Body mass index is 14.34   Treatment:INTERVENTION:  Ensure Complete po BID, each supplement provides 350 kcal and 13 grams of protein. educated pt on high protein, high calorie diet MVI daily Monitor: PO intake, labs, skin assessments, weight changes, I/O's       Thank You, Natasha GaussGarnet C Marianne [name] ,RN Clinical Documentation Specialist:  [phone]  [facility]- Health Information Management

## 2013-12-19 NOTE — Progress Notes (Signed)
UR chart review completed.  

## 2013-12-19 NOTE — Care Management Note (Addendum)
    Page 1 of 1   12/20/2013     1:42:24 PM CARE MANAGEMENT NOTE 12/20/2013  Patient:  Natasha FarrierGWYNN,Natasha U   Account Number:  [id number]  Date Initiated:  12/19/2013  Documentation initiated by:  Sharrie RothmanBLACKWELL,Sharine Cadle C  Subjective/Objective Assessment:   Pt admitted from home with COPD. Pt lives with her son and will return home at discharge. Pt is independent with ADL's.     Action/Plan:   WIll follow for discharge planning needs and possible need for home O2.   Anticipated DC Date:  12/21/2013   Anticipated DC Plan:  HOME/SELF CARE      DC Planning Services  CM consult      PAC Choice  DURABLE MEDICAL EQUIPMENT   Choice offered to / List presented to:  C-1 Patient   DME arranged  NEBULIZER MACHINE  OXYGEN      DME agency  [facility]        Status of service:  Completed, signed off Medicare Important Message given?   (If response is "NO", the following Medicare IM given date fields will be blank) Date Medicare IM given:   Date Additional Medicare IM given:    Discharge Disposition:  HOME/SELF CARE  Per UR Regulation:    If discussed at Long Length of Stay Meetings, dates discussed:    Comments:  12/20/13 1340 Arlyss Queenammy Jolissa Kapral, RN BSN CM Pt discharged home today with home O2 and neb machine from [facility]. Portable delivered to pts room prior to discharge. Other DME to be delivered to pts home after discharge. Pt and pts nurse aware of discharge arrangements.  12/19/13 1540 Arlyss Queenammy Khyler Eschmann, RN BSN Cm

## 2013-12-19 NOTE — Progress Notes (Signed)
Echocardiogram 2D Echocardiogram has been performed.  Natasha [name] Idrovo 12/19/2013, 10:28 AM

## 2013-12-20 ENCOUNTER — Encounter (HOSPITAL_COMMUNITY): Payer: Self-pay | Admitting: Internal Medicine

## 2013-12-20 DIAGNOSIS — E43 Unspecified severe protein-calorie malnutrition: Secondary | ICD-10-CM

## 2013-12-20 MED ORDER — LEVOFLOXACIN 250 MG PO TABS: 250.0000 mg | ORAL_TABLET | Freq: Every day | ORAL | Status: DC

## 2013-12-20 MED ORDER — IPRATROPIUM BROMIDE 0.02 % IN SOLN: 0.5000 mg | Freq: Four times a day (QID) | RESPIRATORY_TRACT | Status: AC | PRN

## 2013-12-20 MED ORDER — TIOTROPIUM BROMIDE MONOHYDRATE 18 MCG IN CAPS: 18.0000 ug | ORAL_CAPSULE | Freq: Every day | RESPIRATORY_TRACT | Status: DC

## 2013-12-20 MED ORDER — BUDESONIDE-FORMOTEROL FUMARATE 160-4.5 MCG/ACT IN AERO: 2.0000 | INHALATION_SPRAY | Freq: Two times a day (BID) | RESPIRATORY_TRACT | Status: DC

## 2013-12-20 MED ORDER — ALBUTEROL SULFATE (2.5 MG/3ML) 0.083% IN NEBU: 2.5000 mg | INHALATION_SOLUTION | Freq: Four times a day (QID) | RESPIRATORY_TRACT | Status: AC | PRN

## 2013-12-20 MED ORDER — LEVOFLOXACIN 250 MG PO TABS
250.0000 mg | ORAL_TABLET | Freq: Every day | ORAL | Status: DC
  Administered 2013-12-20: 250 mg via ORAL
  Filled 2013-12-20: qty 1

## 2013-12-20 MED ORDER — ALBUTEROL SULFATE HFA 108 (90 BASE) MCG/ACT IN AERS: 1.0000 | INHALATION_SPRAY | RESPIRATORY_TRACT | Status: AC | PRN

## 2013-12-20 MED ORDER — DM-GUAIFENESIN ER 30-600 MG PO TB12: 1.0000 | ORAL_TABLET | Freq: Two times a day (BID) | ORAL | Status: DC

## 2013-12-20 MED ORDER — PREDNISONE 10 MG PO TABS
ORAL_TABLET | ORAL | Status: DC
Start: 2013-12-20 — End: 2022-12-19

## 2013-12-20 MED ORDER — ENSURE COMPLETE PO LIQD: 237.0000 mL | Freq: Two times a day (BID) | ORAL | Status: DC

## 2013-12-20 NOTE — Progress Notes (Addendum)
O2 sats Resting on 2L: 95%    Resting on Room Air: 91%    Ambulating on Room Air: 87%    Rebounded ambulating on 2L: 95%

## 2013-12-20 NOTE — Discharge Summary (Signed)
Patient seen and examined.  Above note reviewed.  Patient was admitted to the hospital with a COPD exacerbation and acute respiratory failure. The patient has severe COPD per chest x-ray. She had been started on Symbicort and Spiriva by her primary care physician. The patient was treated conservatively with intravenous steroids, bronchodilators and antibiotics. She gradually began to improve. At this time, she is ambulating without any significant difficulty. She is requiring oxygen to maintain saturations greater than 90 on ambulation. She may need to discharge home on oxygen for a short while, this can be followed up by her primary care physician. Arrangements for home nebulizer machine will be made. She was noted to be significantly tachycardic on admission, this was felt to be reactive to her underlying respiratory issues. This has improved since admission, although she still mildly tachycardic. This can be followed up in the outpatient setting. She's otherwise asymptomatic.  Darden RestaurantsJehanzeb [name]

## 2013-12-20 NOTE — Discharge Summary (Signed)
Physician Discharge Summary  Natasha [name] UJW:[id number] DOB: 06-30-1956 DOA: 12/18/2013  PCP: Colette RibasGOLDING, [name], MD  Admit date: 12/18/2013 Discharge date: 12/20/2013  Time spent: 40 minutes  Recommendations for Outpatient Follow-up:  1. Has appointment 12/25/13 with PCP for evaluation of respiratory status and continued need for oxygen. Recommend OP pulmonary function tests and possible pulmonologist referral.  2. Evaluated BP as recently prescribed losartan but patient had not started. Remained normotensive during hospitalization   Discharge Diagnoses:  Active Problems:   COPD exacerbation   HTN (hypertension)   Protein-calorie malnutrition, severe   Acute respiratory failure   Discharge Condition: stable  Diet recommendation: regular  Filed Weights   12/18/13 1247 12/18/13 1750  Weight: 37.649 kg (83 lb) 34.4 kg (75 lb 13.4 oz)    History of present illness:  58 year old female with history of hypertension, recently started on Losartan by her primary care provider, came to the ED with worsening shortness of breath which started 4 days prior. Patient saw her PCP 4 days prior for worsening shortness of breath, at that time patient was started on Levaquin and Symbicort. Before that patient also had seen Dr. Regino SchultzeMcGough who prescribed her Augmentin and prednisone. Patient did not feel better so she came to the hospital on 12/18/13. She has been smoking for 30 years, did not have to use inhalers at home. She did not have PFTs to Diagnose COPD or emphysema.  Patient also had fevers 3 days ago with MAXIMUM TEMPERATURE 101. She denied nausea vomiting or diarrhea, denied dysuria. She denied chest pain, no hemoptysis, she complained of white-colored phlegm with coughing.  In the ED patient was given prednisone, Levaquin and DuoNeb nebulizers. Her breathing improved but O2 sats drop in 80's on ambulation.    Hospital Course:  1. Acute respiratory failure. Related to COPD exacerbation.  Admitted to medical floor.provided with oxygen supplementation, solumedrol, levaquin and nebs with flutter valve. She quickly improved however remained hypoxic with ambulation. Will be discharged on home oxygen.  Echocardiogram showed normal ejection fraction. Elevated pulmonary pressures are likely related to lung disease. Has appointment with PCP 12/25/13 for evaluation of respiratory status and continued need for oxygen. Recommend PFT's and possible pulmonologist referral 2. COPD exacerbation. See #1. Will discharge with prednisone taper. Continue home inhalers symbicort 3. Hypertension. Patient was prescribed losartan as an outpatient but has not taken this today. She remained normotensive. Recommend continue to hold until re-evaluated by PCP.    Procedures: Echo: Procedure narrative: Transthoracic echocardiography. Image quality was poor. The study was technically difficult, as a result of poor acoustic windows and poor sound wave transmission. - Left ventricle: The cavity size was normal. Wall thickness was normal. Systolic function was normal. The estimated ejection fraction was in the range of 60% to 65%. The study is not technically sufficient to allow evaluation of LV diastolic function. - Pulmonary arteries: Systolic pressure was moderately increased. PA peak pressure: 43mm Hg (S).  Consultations:  none  Discharge Exam: Filed Vitals:   12/20/13 0609  BP: 127/75  Pulse: 106  Temp: 97.9 F (36.6 C)  Resp: 20    General: well nourished NAD Cardiovascular: RRR No MGR No LE edema Respiratory: normal effort with conversation. BS diminished throughout. No wheeze  Discharge Instructions You were cared for by a hospitalist during your hospital stay. If you have any questions about your discharge medications or the care you received while you were in the hospital after you are discharged, you can call the unit and  asked to speak with the hospitalist on call if the hospitalist  that took care of you is not available. Once you are discharged, your primary care physician will handle any further medical issues. Please note that NO REFILLS for any discharge medications will be authorized once you are discharged, as it is imperative that you return to your primary care physician (or establish a relationship with a primary care physician if you do not have one) for your aftercare needs so that they can reassess your need for medications and monitor your lab values.     Medication List         albuterol 108 (90 BASE) MCG/ACT inhaler  Commonly known as:  PROVENTIL HFA;VENTOLIN HFA  Inhale 1 puff into the lungs 4 (four) times daily as needed for wheezing or shortness of breath.     ALPRAZolam 1 MG tablet  Commonly known as:  XANAX  Take 1 mg by mouth every 6 (six) hours as needed for anxiety.     budesonide-formoterol 160-4.5 MCG/ACT inhaler  Commonly known as:  SYMBICORT  Inhale 2 puffs into the lungs 2 (two) times daily.     dextromethorphan-guaiFENesin 30-600 MG per 12 hr tablet  Commonly known as:  MUCINEX DM  Take 1 tablet by mouth 2 (two) times daily.     feeding supplement (ENSURE COMPLETE) Liqd  Take 237 mLs by mouth 2 (two) times daily between meals.     ibuprofen 200 MG tablet  Commonly known as:  ADVIL,MOTRIN  Take 400 mg by mouth every 6 (six) hours as needed for moderate pain.     levofloxacin 250 MG tablet  Commonly known as:  LEVAQUIN  Take 1 tablet (250 mg total) by mouth daily.     predniSONE 10 MG tablet  Commonly known as:  DELTASONE  Take 4 tabs for 3 days starting 4/23 then take 2 tabs for 3 days then take 1 tab for 3 days then stop.       No Known Allergies     Follow-up Information   Follow up with Lenise Herald, PA-C On 12/25/2013. (arrive at 11:15 for 11/30 appointment. office will be calling on 4/22 or 4/23 for follow up. )    Specialty:  Physician Assistant   Contact information:   [address] [place] [address]         The results of significant diagnostics from this hospitalization (including imaging, microbiology, ancillary and laboratory) are listed below for reference.    Significant Diagnostic Studies: Dg Chest 2 View  12/18/2013   CLINICAL DATA:  Shortness of breath.  EXAM: CHEST  2 VIEW  COMPARISON:  12/14/2013  FINDINGS: Severe hyperinflation/COPD changes. Heart and mediastinal contours are within normal limits. No focal opacities or effusions. No acute bony abnormality.  IMPRESSION: Severe COPD.  No active disease.   Electronically Signed   By: Charlett Nose M.D.   On: 12/18/2013 13:44   Dg Chest 2 View  12/14/2013   CLINICAL DATA:  Cough, congestion, fever, smoker  EXAM: CHEST  2 VIEW  COMPARISON:  None  FINDINGS: Normal heart size, mediastinal contours, and pulmonary vascularity.  Emphysematous and bronchitic changes consistent with COPD.  No acute infiltrate, pleural effusion or pneumothorax.  Bones demineralized.  IMPRESSION: COPD changes.  No acute abnormalities.   Electronically Signed   By: Ulyses Southward M.D.   On: 12/14/2013 14:46    Microbiology: No results found for this or any previous visit (from the past 240 hour(s)).  Labs: Basic Metabolic Panel:  Recent Labs Lab 12/18/13 1315 12/19/13 0453  NA 142 142  K 4.8 3.9  CL 97 99  CO2 33* 31  GLUCOSE 105* 138*  BUN 10 14  CREATININE 0.48* 0.47*  CALCIUM 10.8* 9.5   Liver Function Tests:  Recent Labs Lab 12/19/13 0453  AST 19  ALT 21  ALKPHOS 66  BILITOT 0.2*  PROT 6.8  ALBUMIN 3.4*   No results found for this basename: LIPASE, AMYLASE,  in the last 168 hours No results found for this basename: AMMONIA,  in the last 168 hours CBC:  Recent Labs Lab 12/18/13 1315 12/19/13 0453  WBC 5.3 4.4  NEUTROABS 3.8  --   HGB 13.9 12.8  HCT 43.6 39.3  MCV 96.5 95.4  PLT 360 336   Cardiac Enzymes:  Recent Labs Lab 12/18/13 1311 12/18/13 1810 12/18/13 2340 12/19/13 0453  TROPONINI <0.30 <0.30 <0.30 <0.30    BNP: BNP (last 3 results)  Recent Labs  12/18/13 1315  PROBNP 119.1   CBG: No results found for this basename: GLUCAP,  in the last 168 hours     Signed:  Lesle ChrisKaren M Aldrin Engelhard  [facility] 12/20/2013, 11:56 AM

## 2013-12-20 NOTE — Progress Notes (Signed)
ANTIBIOTIC CONSULT NOTE  Pharmacy Consult for Levaquin Indication: bronchitis  No Known Allergies  Patient Measurements: Height: 5\' 1"  (154.9 cm) Weight: 75 lb 13.4 oz (34.4 kg) IBW/kg (Calculated) : 47.8  Vital Signs: Temp: 97.9 F (36.6 C) (04/22 0609) Temp src: Oral (04/22 0609) BP: 127/75 mmHg (04/22 0609) Pulse Rate: 106 (04/22 0609) Intake/Output from previous day: 04/21 0701 - 04/22 0700 In: 480 [P.O.:480] Out: 2400 [Urine:2400] Intake/Output from this shift: Total I/O In: 240 [P.O.:240] Out: 200 [Urine:200]  Labs:  Recent Labs  12/18/13 1315 12/19/13 0453  WBC 5.3 4.4  HGB 13.9 12.8  PLT 360 336  CREATININE 0.48* 0.47*   Estimated Creatinine Clearance: 41.6 ml/min (by C-G formula based on Cr of 0.47). No results found for this basename: VANCOTROUGH, VANCOPEAK, VANCORANDOM, GENTTROUGH, GENTPEAK, GENTRANDOM, TOBRATROUGH, TOBRAPEAK, TOBRARND, AMIKACINPEAK, AMIKACINTROU, AMIKACIN,  in the last 72 hours   Microbiology: No results found for this or any previous visit (from the past 720 hour(s)).  Medical History: Past Medical History  Diagnosis Date  . COPD (chronic obstructive pulmonary disease)     Medications:  Scheduled:  . antiseptic oral rinse  15 mL Mouth Rinse BID  . budesonide-formoterol  2 puff Inhalation BID  . dextromethorphan-guaiFENesin  1 tablet Oral BID  . enoxaparin (LOVENOX) injection  30 mg Subcutaneous Q24H  . feeding supplement (ENSURE COMPLETE)  237 mL Oral BID BM  . guaiFENesin  1,200 mg Oral BID  . ipratropium  0.5 mg Nebulization Q6H  . levalbuterol  0.63 mg Nebulization Q6H  . levofloxacin  250 mg Oral Daily  . methylPREDNISolone (SOLU-MEDROL) injection  60 mg Intravenous 4 times per day  . pneumococcal 23 valent vaccine  0.5 mL Intramuscular Tomorrow-1000   Assessment: 58 yo F admitted with worsening shortness of breath & reported fever PTA.  She was started empirically on Levaquin for bronchitis/COPD exacerbation.   Renal  function remains at patient's baseline. Patient is clinically improving.  Plans for discharge home today.  Levaquin 4/20>>  Goal of Therapy:  Eradicate infection.  Plan:  Change Levaquin 250mg  PO q24h Duration of therapy per MD.  Recommend total of 5-7 days. Pharmacy to sign off.  Please re-consult as needed.    Mercy RidingAndrea Michelle Dwane Andres 12/20/2013,10:23 AM

## 2020-03-25 DIAGNOSIS — J449 Chronic obstructive pulmonary disease, unspecified: Secondary | ICD-10-CM | POA: Diagnosis not present

## 2020-03-25 DIAGNOSIS — E039 Hypothyroidism, unspecified: Secondary | ICD-10-CM | POA: Diagnosis not present

## 2020-03-25 DIAGNOSIS — R0902 Hypoxemia: Secondary | ICD-10-CM | POA: Diagnosis not present

## 2020-03-25 DIAGNOSIS — Z1389 Encounter for screening for other disorder: Secondary | ICD-10-CM | POA: Diagnosis not present

## 2020-03-25 DIAGNOSIS — Z0001 Encounter for general adult medical examination with abnormal findings: Secondary | ICD-10-CM | POA: Diagnosis not present

## 2020-03-25 DIAGNOSIS — I10 Essential (primary) hypertension: Secondary | ICD-10-CM | POA: Diagnosis not present

## 2020-03-25 DIAGNOSIS — R0602 Shortness of breath: Secondary | ICD-10-CM | POA: Diagnosis not present

## 2020-03-25 DIAGNOSIS — J9610 Chronic respiratory failure, unspecified whether with hypoxia or hypercapnia: Secondary | ICD-10-CM | POA: Diagnosis not present

## 2020-03-25 DIAGNOSIS — R0609 Other forms of dyspnea: Secondary | ICD-10-CM | POA: Diagnosis not present

## 2020-03-25 DIAGNOSIS — E782 Mixed hyperlipidemia: Secondary | ICD-10-CM | POA: Diagnosis not present

## 2020-03-25 DIAGNOSIS — Z681 Body mass index (BMI) 19 or less, adult: Secondary | ICD-10-CM | POA: Diagnosis not present

## 2020-03-27 DIAGNOSIS — Z1211 Encounter for screening for malignant neoplasm of colon: Secondary | ICD-10-CM | POA: Diagnosis not present

## 2020-04-25 DIAGNOSIS — J449 Chronic obstructive pulmonary disease, unspecified: Secondary | ICD-10-CM | POA: Diagnosis not present

## 2020-04-25 DIAGNOSIS — R0602 Shortness of breath: Secondary | ICD-10-CM | POA: Diagnosis not present

## 2020-05-26 DIAGNOSIS — R0602 Shortness of breath: Secondary | ICD-10-CM | POA: Diagnosis not present

## 2020-05-26 DIAGNOSIS — J449 Chronic obstructive pulmonary disease, unspecified: Secondary | ICD-10-CM | POA: Diagnosis not present

## 2020-06-25 DIAGNOSIS — J449 Chronic obstructive pulmonary disease, unspecified: Secondary | ICD-10-CM | POA: Diagnosis not present

## 2020-06-25 DIAGNOSIS — R0602 Shortness of breath: Secondary | ICD-10-CM | POA: Diagnosis not present

## 2020-07-26 DIAGNOSIS — J449 Chronic obstructive pulmonary disease, unspecified: Secondary | ICD-10-CM | POA: Diagnosis not present

## 2020-07-26 DIAGNOSIS — R0602 Shortness of breath: Secondary | ICD-10-CM | POA: Diagnosis not present

## 2020-08-25 DIAGNOSIS — J449 Chronic obstructive pulmonary disease, unspecified: Secondary | ICD-10-CM | POA: Diagnosis not present

## 2020-08-25 DIAGNOSIS — R0602 Shortness of breath: Secondary | ICD-10-CM | POA: Diagnosis not present

## 2020-08-27 DIAGNOSIS — J329 Chronic sinusitis, unspecified: Secondary | ICD-10-CM | POA: Diagnosis not present

## 2020-08-27 DIAGNOSIS — I10 Essential (primary) hypertension: Secondary | ICD-10-CM | POA: Diagnosis not present

## 2020-09-25 DIAGNOSIS — J449 Chronic obstructive pulmonary disease, unspecified: Secondary | ICD-10-CM | POA: Diagnosis not present

## 2020-09-25 DIAGNOSIS — R0602 Shortness of breath: Secondary | ICD-10-CM | POA: Diagnosis not present

## 2020-10-26 DIAGNOSIS — J449 Chronic obstructive pulmonary disease, unspecified: Secondary | ICD-10-CM | POA: Diagnosis not present

## 2020-10-26 DIAGNOSIS — R0602 Shortness of breath: Secondary | ICD-10-CM | POA: Diagnosis not present

## 2020-11-23 DIAGNOSIS — R0602 Shortness of breath: Secondary | ICD-10-CM | POA: Diagnosis not present

## 2020-11-23 DIAGNOSIS — J449 Chronic obstructive pulmonary disease, unspecified: Secondary | ICD-10-CM | POA: Diagnosis not present

## 2020-12-24 DIAGNOSIS — R0602 Shortness of breath: Secondary | ICD-10-CM | POA: Diagnosis not present

## 2020-12-24 DIAGNOSIS — J449 Chronic obstructive pulmonary disease, unspecified: Secondary | ICD-10-CM | POA: Diagnosis not present

## 2021-01-15 DIAGNOSIS — J0100 Acute maxillary sinusitis, unspecified: Secondary | ICD-10-CM | POA: Diagnosis not present

## 2021-01-15 DIAGNOSIS — J449 Chronic obstructive pulmonary disease, unspecified: Secondary | ICD-10-CM | POA: Diagnosis not present

## 2021-01-15 DIAGNOSIS — Z681 Body mass index (BMI) 19 or less, adult: Secondary | ICD-10-CM | POA: Diagnosis not present

## 2021-01-23 DIAGNOSIS — R0602 Shortness of breath: Secondary | ICD-10-CM | POA: Diagnosis not present

## 2021-01-23 DIAGNOSIS — J449 Chronic obstructive pulmonary disease, unspecified: Secondary | ICD-10-CM | POA: Diagnosis not present

## 2021-02-23 DIAGNOSIS — J449 Chronic obstructive pulmonary disease, unspecified: Secondary | ICD-10-CM | POA: Diagnosis not present

## 2021-02-23 DIAGNOSIS — R0602 Shortness of breath: Secondary | ICD-10-CM | POA: Diagnosis not present

## 2021-03-25 DIAGNOSIS — R0602 Shortness of breath: Secondary | ICD-10-CM | POA: Diagnosis not present

## 2021-03-25 DIAGNOSIS — J449 Chronic obstructive pulmonary disease, unspecified: Secondary | ICD-10-CM | POA: Diagnosis not present

## 2021-04-23 DIAGNOSIS — Z1389 Encounter for screening for other disorder: Secondary | ICD-10-CM | POA: Diagnosis not present

## 2021-04-23 DIAGNOSIS — E782 Mixed hyperlipidemia: Secondary | ICD-10-CM | POA: Diagnosis not present

## 2021-04-23 DIAGNOSIS — J449 Chronic obstructive pulmonary disease, unspecified: Secondary | ICD-10-CM | POA: Diagnosis not present

## 2021-04-23 DIAGNOSIS — Z0001 Encounter for general adult medical examination with abnormal findings: Secondary | ICD-10-CM | POA: Diagnosis not present

## 2021-04-23 DIAGNOSIS — Z681 Body mass index (BMI) 19 or less, adult: Secondary | ICD-10-CM | POA: Diagnosis not present

## 2021-04-23 DIAGNOSIS — J329 Chronic sinusitis, unspecified: Secondary | ICD-10-CM | POA: Diagnosis not present

## 2021-04-23 DIAGNOSIS — E039 Hypothyroidism, unspecified: Secondary | ICD-10-CM | POA: Diagnosis not present

## 2021-04-25 DIAGNOSIS — R0602 Shortness of breath: Secondary | ICD-10-CM | POA: Diagnosis not present

## 2021-04-25 DIAGNOSIS — J449 Chronic obstructive pulmonary disease, unspecified: Secondary | ICD-10-CM | POA: Diagnosis not present

## 2021-05-26 DIAGNOSIS — J449 Chronic obstructive pulmonary disease, unspecified: Secondary | ICD-10-CM | POA: Diagnosis not present

## 2021-05-26 DIAGNOSIS — R0602 Shortness of breath: Secondary | ICD-10-CM | POA: Diagnosis not present

## 2021-06-25 ENCOUNTER — Other Ambulatory Visit: Payer: Self-pay | Admitting: Family Medicine

## 2021-06-25 DIAGNOSIS — J449 Chronic obstructive pulmonary disease, unspecified: Secondary | ICD-10-CM | POA: Diagnosis not present

## 2021-06-25 DIAGNOSIS — R0602 Shortness of breath: Secondary | ICD-10-CM | POA: Diagnosis not present

## 2021-06-26 ENCOUNTER — Other Ambulatory Visit: Payer: Self-pay | Admitting: Physician Assistant

## 2021-06-26 DIAGNOSIS — E2839 Other primary ovarian failure: Secondary | ICD-10-CM

## 2021-06-30 ENCOUNTER — Ambulatory Visit
Admission: RE | Admit: 2021-06-30 | Discharge: 2021-06-30 | Disposition: A | Discharge status: Home / Self Care | Payer: Medicare Other | Source: Ambulatory Visit | Attending: Physician Assistant | Admitting: Physician Assistant

## 2021-06-30 ENCOUNTER — Other Ambulatory Visit: Payer: Self-pay

## 2021-06-30 DIAGNOSIS — M810 Age-related osteoporosis without current pathological fracture: Secondary | ICD-10-CM | POA: Diagnosis not present

## 2021-06-30 DIAGNOSIS — E2839 Other primary ovarian failure: Secondary | ICD-10-CM

## 2021-06-30 DIAGNOSIS — Z780 Asymptomatic menopausal state: Secondary | ICD-10-CM | POA: Diagnosis not present

## 2021-07-21 ENCOUNTER — Other Ambulatory Visit: Payer: Self-pay

## 2021-07-26 DIAGNOSIS — J449 Chronic obstructive pulmonary disease, unspecified: Secondary | ICD-10-CM | POA: Diagnosis not present

## 2021-07-26 DIAGNOSIS — R0602 Shortness of breath: Secondary | ICD-10-CM | POA: Diagnosis not present

## 2021-08-13 DIAGNOSIS — J449 Chronic obstructive pulmonary disease, unspecified: Secondary | ICD-10-CM | POA: Diagnosis not present

## 2021-08-25 DIAGNOSIS — R0602 Shortness of breath: Secondary | ICD-10-CM | POA: Diagnosis not present

## 2021-08-25 DIAGNOSIS — J449 Chronic obstructive pulmonary disease, unspecified: Secondary | ICD-10-CM | POA: Diagnosis not present

## 2021-09-03 DIAGNOSIS — H209 Unspecified iridocyclitis: Secondary | ICD-10-CM | POA: Diagnosis not present

## 2021-09-03 DIAGNOSIS — J329 Chronic sinusitis, unspecified: Secondary | ICD-10-CM | POA: Diagnosis not present

## 2021-09-03 DIAGNOSIS — Z681 Body mass index (BMI) 19 or less, adult: Secondary | ICD-10-CM | POA: Diagnosis not present

## 2021-09-03 DIAGNOSIS — H109 Unspecified conjunctivitis: Secondary | ICD-10-CM | POA: Diagnosis not present

## 2021-09-03 DIAGNOSIS — L03213 Periorbital cellulitis: Secondary | ICD-10-CM | POA: Diagnosis not present

## 2021-09-25 DIAGNOSIS — R0602 Shortness of breath: Secondary | ICD-10-CM | POA: Diagnosis not present

## 2021-09-25 DIAGNOSIS — J449 Chronic obstructive pulmonary disease, unspecified: Secondary | ICD-10-CM | POA: Diagnosis not present

## 2021-09-26 DIAGNOSIS — H109 Unspecified conjunctivitis: Secondary | ICD-10-CM | POA: Diagnosis not present

## 2021-09-26 DIAGNOSIS — J449 Chronic obstructive pulmonary disease, unspecified: Secondary | ICD-10-CM | POA: Diagnosis not present

## 2021-09-26 DIAGNOSIS — Z681 Body mass index (BMI) 19 or less, adult: Secondary | ICD-10-CM | POA: Diagnosis not present

## 2021-10-26 DIAGNOSIS — J449 Chronic obstructive pulmonary disease, unspecified: Secondary | ICD-10-CM | POA: Diagnosis not present

## 2021-10-26 DIAGNOSIS — R0602 Shortness of breath: Secondary | ICD-10-CM | POA: Diagnosis not present

## 2021-11-23 DIAGNOSIS — J449 Chronic obstructive pulmonary disease, unspecified: Secondary | ICD-10-CM | POA: Diagnosis not present

## 2021-11-23 DIAGNOSIS — R0602 Shortness of breath: Secondary | ICD-10-CM | POA: Diagnosis not present

## 2021-11-25 DIAGNOSIS — J329 Chronic sinusitis, unspecified: Secondary | ICD-10-CM | POA: Diagnosis not present

## 2021-12-24 DIAGNOSIS — R0602 Shortness of breath: Secondary | ICD-10-CM | POA: Diagnosis not present

## 2021-12-24 DIAGNOSIS — J449 Chronic obstructive pulmonary disease, unspecified: Secondary | ICD-10-CM | POA: Diagnosis not present

## 2022-01-23 DIAGNOSIS — J449 Chronic obstructive pulmonary disease, unspecified: Secondary | ICD-10-CM | POA: Diagnosis not present

## 2022-01-23 DIAGNOSIS — R0602 Shortness of breath: Secondary | ICD-10-CM | POA: Diagnosis not present

## 2022-02-23 DIAGNOSIS — R0602 Shortness of breath: Secondary | ICD-10-CM | POA: Diagnosis not present

## 2022-02-23 DIAGNOSIS — J449 Chronic obstructive pulmonary disease, unspecified: Secondary | ICD-10-CM | POA: Diagnosis not present

## 2022-03-25 DIAGNOSIS — R0602 Shortness of breath: Secondary | ICD-10-CM | POA: Diagnosis not present

## 2022-03-25 DIAGNOSIS — J449 Chronic obstructive pulmonary disease, unspecified: Secondary | ICD-10-CM | POA: Diagnosis not present

## 2022-04-25 DIAGNOSIS — J449 Chronic obstructive pulmonary disease, unspecified: Secondary | ICD-10-CM | POA: Diagnosis not present

## 2022-04-25 DIAGNOSIS — R0602 Shortness of breath: Secondary | ICD-10-CM | POA: Diagnosis not present

## 2022-04-27 DIAGNOSIS — Z681 Body mass index (BMI) 19 or less, adult: Secondary | ICD-10-CM | POA: Diagnosis not present

## 2022-04-27 DIAGNOSIS — D518 Other vitamin B12 deficiency anemias: Secondary | ICD-10-CM | POA: Diagnosis not present

## 2022-04-27 DIAGNOSIS — J449 Chronic obstructive pulmonary disease, unspecified: Secondary | ICD-10-CM | POA: Diagnosis not present

## 2022-04-27 DIAGNOSIS — I10 Essential (primary) hypertension: Secondary | ICD-10-CM | POA: Diagnosis not present

## 2022-04-27 DIAGNOSIS — E559 Vitamin D deficiency, unspecified: Secondary | ICD-10-CM | POA: Diagnosis not present

## 2022-04-27 DIAGNOSIS — Z0001 Encounter for general adult medical examination with abnormal findings: Secondary | ICD-10-CM | POA: Diagnosis not present

## 2022-04-27 DIAGNOSIS — E039 Hypothyroidism, unspecified: Secondary | ICD-10-CM | POA: Diagnosis not present

## 2022-04-27 DIAGNOSIS — J329 Chronic sinusitis, unspecified: Secondary | ICD-10-CM | POA: Diagnosis not present

## 2022-05-26 DIAGNOSIS — R0602 Shortness of breath: Secondary | ICD-10-CM | POA: Diagnosis not present

## 2022-05-26 DIAGNOSIS — J449 Chronic obstructive pulmonary disease, unspecified: Secondary | ICD-10-CM | POA: Diagnosis not present

## 2022-06-25 DIAGNOSIS — R0602 Shortness of breath: Secondary | ICD-10-CM | POA: Diagnosis not present

## 2022-06-25 DIAGNOSIS — J449 Chronic obstructive pulmonary disease, unspecified: Secondary | ICD-10-CM | POA: Diagnosis not present

## 2022-07-15 DIAGNOSIS — Z23 Encounter for immunization: Secondary | ICD-10-CM | POA: Diagnosis not present

## 2022-07-26 DIAGNOSIS — R0602 Shortness of breath: Secondary | ICD-10-CM | POA: Diagnosis not present

## 2022-07-26 DIAGNOSIS — J449 Chronic obstructive pulmonary disease, unspecified: Secondary | ICD-10-CM | POA: Diagnosis not present

## 2022-08-11 DIAGNOSIS — J449 Chronic obstructive pulmonary disease, unspecified: Secondary | ICD-10-CM | POA: Diagnosis not present

## 2022-08-11 DIAGNOSIS — I10 Essential (primary) hypertension: Secondary | ICD-10-CM | POA: Diagnosis not present

## 2022-08-11 DIAGNOSIS — J329 Chronic sinusitis, unspecified: Secondary | ICD-10-CM | POA: Diagnosis not present

## 2022-08-25 DIAGNOSIS — J449 Chronic obstructive pulmonary disease, unspecified: Secondary | ICD-10-CM | POA: Diagnosis not present

## 2022-08-25 DIAGNOSIS — R0602 Shortness of breath: Secondary | ICD-10-CM | POA: Diagnosis not present

## 2022-09-25 DIAGNOSIS — J449 Chronic obstructive pulmonary disease, unspecified: Secondary | ICD-10-CM | POA: Diagnosis not present

## 2022-09-25 DIAGNOSIS — R0602 Shortness of breath: Secondary | ICD-10-CM | POA: Diagnosis not present

## 2022-10-26 DIAGNOSIS — R0602 Shortness of breath: Secondary | ICD-10-CM | POA: Diagnosis not present

## 2022-10-26 DIAGNOSIS — J449 Chronic obstructive pulmonary disease, unspecified: Secondary | ICD-10-CM | POA: Diagnosis not present

## 2022-11-16 DIAGNOSIS — I10 Essential (primary) hypertension: Secondary | ICD-10-CM | POA: Diagnosis not present

## 2022-11-16 DIAGNOSIS — J449 Chronic obstructive pulmonary disease, unspecified: Secondary | ICD-10-CM | POA: Diagnosis not present

## 2022-11-16 DIAGNOSIS — J329 Chronic sinusitis, unspecified: Secondary | ICD-10-CM | POA: Diagnosis not present

## 2022-11-24 DIAGNOSIS — R0602 Shortness of breath: Secondary | ICD-10-CM | POA: Diagnosis not present

## 2022-11-24 DIAGNOSIS — J449 Chronic obstructive pulmonary disease, unspecified: Secondary | ICD-10-CM | POA: Diagnosis not present

## 2022-12-19 ENCOUNTER — Ambulatory Visit
Admission: EM | Admit: 2022-12-19 | Discharge: 2022-12-19 | Disposition: A | Discharge status: Home / Self Care | Payer: Medicare Other | Attending: Family Medicine | Admitting: Family Medicine

## 2022-12-19 ENCOUNTER — Encounter: Payer: Self-pay | Admitting: Emergency Medicine

## 2022-12-19 ENCOUNTER — Other Ambulatory Visit: Payer: Self-pay

## 2022-12-19 DIAGNOSIS — J029 Acute pharyngitis, unspecified: Secondary | ICD-10-CM | POA: Diagnosis not present

## 2022-12-19 LAB — POCT RAPID STREP A (OFFICE): Rapid Strep A Screen: NEGATIVE

## 2022-12-19 MED ORDER — LIDOCAINE VISCOUS HCL 2 % MT SOLN: 5.0000 mL | Freq: Four times a day (QID) | OROMUCOSAL | 0 refills | Status: DC | PRN

## 2022-12-19 NOTE — ED Provider Notes (Signed)
**Note Natasha-Identified via Obfuscation**   [facility]   [id number] 12/19/22 Arrival Time: 1104  ASSESSMENT & PLAN:  1. Sore throat    Rapid strep negative. Throat culture pending. No signs of peritonsillar abscess. Discussed.  Meds ordered this encounter  Medications   magic mouthwash (lidocaine, diphenhydrAMINE, alum & mag hydroxide) suspension    Sig: Swish and spit 5 mLs 4 (four) times daily as needed for mouth pain.    Dispense:  360 mL    Refill:  0   OTC analgesics and throat care as needed    Discharge Instructions      You may use over the counter ibuprofen or acetaminophen as needed.  For a sore throat, over the counter products such as [employer] Peroxyl Mouth Sore Rinse or Chloraseptic Sore Throat Spray may provide some temporary relief. Your rapid strep test was negative today. We have sent your throat swab for culture and will let you know of any positive results.      Follow-up Information     Assunta Found, MD.   Specialty: Family Medicine Why: As needed. Contact information: [address] [place] [address] [phone]                Reviewed expectations re: course of current medical issues. Questions answered. Outlined signs and symptoms indicating need for more acute intervention. Patient verbalized understanding. After Visit Summary given.   SUBJECTIVE:  Natasha [name] is a 67 y.o. female who reports a sore throat. Pt reports sore throat since yesterday with intermittent emesis. Has had strep exposure. Has been a little congested also; mild coughing. Otherwise well. Denies fever.   OBJECTIVE:  Vitals:   12/19/22 1115  Pulse: 90  Resp: 20  Temp: 98.1 F (36.7 C)  TempSrc: Oral  SpO2: 97%     General appearance: alert; no distress HEENT: throat with mild erythema and cobblestoning; uvula is midline Neck: supple with FROM; no lymphadenopathy Lungs: speaks full sentences without difficulty; unlabored; dry cough  Abd: soft; non-tender Skin:  reveals no rash; warm and dry Psychological: alert and cooperative; normal mood and affect  No Known Allergies  Past Medical History:  Diagnosis Date   COPD (chronic obstructive pulmonary disease)    HTN (hypertension)    Social History   Socioeconomic History   Marital status: Widowed    Spouse name: Not on file   Number of children: Not on file   Years of education: Not on file   Highest education level: Not on file  Occupational History   Not on file  Tobacco Use   Smoking status: Former    Types: Cigarettes    Quit date: 12/11/2013    Years since quitting: 9.0   Smokeless tobacco: Not on file  Substance and Sexual Activity   Alcohol use: No   Drug use: No   Sexual activity: Yes    Birth control/protection: Surgical  Other Topics Concern   Not on file  Social History Narrative   Not on file   Social Determinants of Health   Financial Resource Strain: Not on file  Food Insecurity: Not on file  Transportation Needs: Not on file  Physical Activity: Not on file  Stress: Not on file  Social Connections: Not on file  Intimate Partner Violence: Not on file   History reviewed. No pertinent family history.         Mardella Layman, MD 12/19/22 1229

## 2022-12-19 NOTE — ED Triage Notes (Signed)
Pt reports sore throat since yesterday with intermittent emesis. Has had strep exposure.

## 2022-12-19 NOTE — Discharge Instructions (Signed)
You may use over the counter ibuprofen or acetaminophen as needed.  For a sore throat, over the counter products such as [employer] Peroxyl Mouth Sore Rinse or Chloraseptic Sore Throat Spray may provide some temporary relief. Your rapid strep test was negative today. We have sent your throat swab for culture and will let you know of any positive results. 

## 2022-12-21 DIAGNOSIS — Z681 Body mass index (BMI) 19 or less, adult: Secondary | ICD-10-CM | POA: Diagnosis not present

## 2022-12-21 DIAGNOSIS — J449 Chronic obstructive pulmonary disease, unspecified: Secondary | ICD-10-CM | POA: Diagnosis not present

## 2022-12-21 DIAGNOSIS — I10 Essential (primary) hypertension: Secondary | ICD-10-CM | POA: Diagnosis not present

## 2022-12-21 DIAGNOSIS — J329 Chronic sinusitis, unspecified: Secondary | ICD-10-CM | POA: Diagnosis not present

## 2022-12-25 DIAGNOSIS — J449 Chronic obstructive pulmonary disease, unspecified: Secondary | ICD-10-CM | POA: Diagnosis not present

## 2022-12-25 DIAGNOSIS — R0602 Shortness of breath: Secondary | ICD-10-CM | POA: Diagnosis not present

## 2023-01-24 DIAGNOSIS — J449 Chronic obstructive pulmonary disease, unspecified: Secondary | ICD-10-CM | POA: Diagnosis not present

## 2023-01-24 DIAGNOSIS — R0602 Shortness of breath: Secondary | ICD-10-CM | POA: Diagnosis not present

## 2023-02-24 DIAGNOSIS — R0602 Shortness of breath: Secondary | ICD-10-CM | POA: Diagnosis not present

## 2023-02-24 DIAGNOSIS — J449 Chronic obstructive pulmonary disease, unspecified: Secondary | ICD-10-CM | POA: Diagnosis not present

## 2023-03-26 DIAGNOSIS — J449 Chronic obstructive pulmonary disease, unspecified: Secondary | ICD-10-CM | POA: Diagnosis not present

## 2023-03-26 DIAGNOSIS — R0602 Shortness of breath: Secondary | ICD-10-CM | POA: Diagnosis not present

## 2023-04-25 DIAGNOSIS — R0602 Shortness of breath: Secondary | ICD-10-CM | POA: Diagnosis not present

## 2023-04-25 DIAGNOSIS — J449 Chronic obstructive pulmonary disease, unspecified: Secondary | ICD-10-CM | POA: Diagnosis not present

## 2023-04-30 DIAGNOSIS — J329 Chronic sinusitis, unspecified: Secondary | ICD-10-CM | POA: Diagnosis not present

## 2023-04-30 DIAGNOSIS — E039 Hypothyroidism, unspecified: Secondary | ICD-10-CM | POA: Diagnosis not present

## 2023-04-30 DIAGNOSIS — Z681 Body mass index (BMI) 19 or less, adult: Secondary | ICD-10-CM | POA: Diagnosis not present

## 2023-04-30 DIAGNOSIS — J449 Chronic obstructive pulmonary disease, unspecified: Secondary | ICD-10-CM | POA: Diagnosis not present

## 2023-04-30 DIAGNOSIS — D518 Other vitamin B12 deficiency anemias: Secondary | ICD-10-CM | POA: Diagnosis not present

## 2023-04-30 DIAGNOSIS — E559 Vitamin D deficiency, unspecified: Secondary | ICD-10-CM | POA: Diagnosis not present

## 2023-04-30 DIAGNOSIS — Z0001 Encounter for general adult medical examination with abnormal findings: Secondary | ICD-10-CM | POA: Diagnosis not present

## 2023-04-30 DIAGNOSIS — I10 Essential (primary) hypertension: Secondary | ICD-10-CM | POA: Diagnosis not present

## 2023-09-27 DIAGNOSIS — H25813 Combined forms of age-related cataract, bilateral: Secondary | ICD-10-CM | POA: Diagnosis not present

## 2023-10-18 DIAGNOSIS — J329 Chronic sinusitis, unspecified: Secondary | ICD-10-CM | POA: Diagnosis not present

## 2023-10-18 DIAGNOSIS — Z0001 Encounter for general adult medical examination with abnormal findings: Secondary | ICD-10-CM | POA: Diagnosis not present

## 2023-10-18 DIAGNOSIS — J9611 Chronic respiratory failure with hypoxia: Secondary | ICD-10-CM | POA: Diagnosis not present

## 2023-10-18 DIAGNOSIS — I10 Essential (primary) hypertension: Secondary | ICD-10-CM | POA: Diagnosis not present

## 2023-10-18 DIAGNOSIS — R6889 Other general symptoms and signs: Secondary | ICD-10-CM | POA: Diagnosis not present

## 2023-10-18 DIAGNOSIS — Z681 Body mass index (BMI) 19 or less, adult: Secondary | ICD-10-CM | POA: Diagnosis not present

## 2023-10-18 DIAGNOSIS — J449 Chronic obstructive pulmonary disease, unspecified: Secondary | ICD-10-CM | POA: Diagnosis not present

## 2023-10-27 DIAGNOSIS — Z9229 Personal history of other drug therapy: Secondary | ICD-10-CM | POA: Diagnosis not present

## 2023-10-27 DIAGNOSIS — I10 Essential (primary) hypertension: Secondary | ICD-10-CM | POA: Diagnosis not present

## 2023-10-27 DIAGNOSIS — Z0001 Encounter for general adult medical examination with abnormal findings: Secondary | ICD-10-CM | POA: Diagnosis not present

## 2023-10-27 DIAGNOSIS — E559 Vitamin D deficiency, unspecified: Secondary | ICD-10-CM | POA: Diagnosis not present

## 2023-11-10 DIAGNOSIS — H2513 Age-related nuclear cataract, bilateral: Secondary | ICD-10-CM | POA: Diagnosis not present

## 2023-11-10 DIAGNOSIS — H35363 Drusen (degenerative) of macula, bilateral: Secondary | ICD-10-CM | POA: Diagnosis not present

## 2023-11-10 DIAGNOSIS — H16223 Keratoconjunctivitis sicca, not specified as Sjogren's, bilateral: Secondary | ICD-10-CM | POA: Diagnosis not present

## 2023-11-10 DIAGNOSIS — H04123 Dry eye syndrome of bilateral lacrimal glands: Secondary | ICD-10-CM | POA: Diagnosis not present

## 2023-12-14 DIAGNOSIS — H2511 Age-related nuclear cataract, right eye: Secondary | ICD-10-CM | POA: Diagnosis not present

## 2023-12-21 NOTE — H&P (Signed)
 Surgical History & Physical  Patient Name: Natasha [name]  DOB: 17-Feb-1956  Surgery: Cataract extraction with intraocular lens implant phacoemulsification; Right Eye Surgeon: Ardeth Krabbe MD Surgery Date: 12/24/2023 Pre-Op Date: 11/10/2023  HPI: A 78 Yr. old female patient 1. The patient is here for cataract eval. Ref: Dr. Barbra Boone. Pt. complains of difficulty when recognizing people. The right eye is affected. The episode is constant. Symptoms occur when the patient is driving. Distance vision is worse than near. The complaint is associated with blurry vision. Pt. uses OTC drops for dry eyes when needed. HPI was performed by Ardeth Krabbe .  Medical History: Glaucoma suspect  Heart Problem High Blood Pressure LDL Lung Problems Thyroid  Problems  Review of Systems Cardiovascular High Blood Pressure, A-fib Endocrine thyroid  disease Respiratory COPD All recorded systems are negative except as noted above.  Social Never smoked   Medication Azithromycin, Amoxicillin-pot clavulanate, Alprazolam , Methylprednisolone , Alendronate, Rosuvastatin, Trelegy Ellipta, Paxlovid  Sx/Procedures Hysterectomy  Drug Allergies  NKDA  History & Physical: Heent: cataract NECK: supple without bruits LUNGS: lungs clear to auscultation CV: regular rate and rhythm Abdomen: soft and non-tender  Impression & Plan: Assessment: 1.  CATARACT NUCLEAR SCLEROSIS AGE RELATED; Both Eyes (H25.13) 2.  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  Dry Eye Syndrome; Both Eyes (H04.123) 4.  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  Cataracts are visually significant and account for the patient's complaints. Discussed all risks, benefits, procedures and recovery, including infection, loss of vision and eye, need for glasses after surgery or additional procedures. Patient understands changing glasses will not improve vision. Patient indicated understanding of procedure. All  questions answered. Patient desires to have surgery, recommend phacoemulsification with intraocular lens. Patient to have preliminary testing necessary (Argos/IOL Master, Mac OCT, TOPO) Educational materials provided:Cataract.  Plan: - Proceed with cataract surgery OD followed by OS - Plan for best distance target with DIB00 - No DM, no fuchs, no prior eye surgery - Early AMD - good dilation - dextenza   2.  Dry eye. Mild signs of dry eye at this time. Can use artificial tears QID OU PRN and warm compresses once daily as needed.  3.  as above  4.  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 OCT mac obtained today. Diagnosis reviewed with patient. Will monitor for now

## 2023-12-22 ENCOUNTER — Encounter (HOSPITAL_COMMUNITY)
Admission: RE | Admit: 2023-12-22 | Discharge: 2023-12-22 | Disposition: A | Discharge status: Home / Self Care | Source: Ambulatory Visit | Attending: Optometry | Admitting: Optometry

## 2023-12-22 ENCOUNTER — Encounter (HOSPITAL_COMMUNITY): Payer: Self-pay

## 2023-12-22 ENCOUNTER — Other Ambulatory Visit: Payer: Self-pay

## 2023-12-22 HISTORY — DX: Unspecified osteoarthritis, unspecified site: M19.90

## 2023-12-24 ENCOUNTER — Encounter (HOSPITAL_COMMUNITY)
Admission: RE | Disposition: A | Discharge status: Home / Self Care | Payer: Self-pay | Source: Home / Self Care | Attending: Optometry

## 2023-12-24 ENCOUNTER — Ambulatory Visit (HOSPITAL_COMMUNITY): Admitting: Anesthesiology

## 2023-12-24 ENCOUNTER — Ambulatory Visit (HOSPITAL_COMMUNITY)
Admission: RE | Admit: 2023-12-24 | Discharge: 2023-12-24 | Disposition: A | Discharge status: Home / Self Care | Attending: Optometry | Admitting: Optometry

## 2023-12-24 ENCOUNTER — Encounter (HOSPITAL_COMMUNITY): Payer: Self-pay | Admitting: Optometry

## 2023-12-24 DIAGNOSIS — H35363 Drusen (degenerative) of macula, bilateral: Secondary | ICD-10-CM | POA: Diagnosis not present

## 2023-12-24 DIAGNOSIS — H353190 Nonexudative age-related macular degeneration, unspecified eye, stage unspecified: Secondary | ICD-10-CM | POA: Diagnosis not present

## 2023-12-24 DIAGNOSIS — H16223 Keratoconjunctivitis sicca, not specified as Sjogren's, bilateral: Secondary | ICD-10-CM | POA: Diagnosis not present

## 2023-12-24 DIAGNOSIS — H2513 Age-related nuclear cataract, bilateral: Secondary | ICD-10-CM | POA: Diagnosis not present

## 2023-12-24 DIAGNOSIS — Z87891 Personal history of nicotine dependence: Secondary | ICD-10-CM | POA: Insufficient documentation

## 2023-12-24 DIAGNOSIS — J449 Chronic obstructive pulmonary disease, unspecified: Secondary | ICD-10-CM | POA: Diagnosis not present

## 2023-12-24 DIAGNOSIS — H2511 Age-related nuclear cataract, right eye: Secondary | ICD-10-CM

## 2023-12-24 DIAGNOSIS — H04123 Dry eye syndrome of bilateral lacrimal glands: Secondary | ICD-10-CM | POA: Insufficient documentation

## 2023-12-24 DIAGNOSIS — H5711 Ocular pain, right eye: Secondary | ICD-10-CM | POA: Diagnosis not present

## 2023-12-24 HISTORY — PX: INSERTION, STENT, DRUG-ELUTING, LACRIMAL CANALICULUS: SHX7453

## 2023-12-24 HISTORY — PX: CATARACT EXTRACTION W/PHACO: SHX586

## 2023-12-24 SURGERY — PHACOEMULSIFICATION, CATARACT, WITH IOL INSERTION
Anesthesia: Monitor Anesthesia Care | Site: Eye | Laterality: Right

## 2023-12-24 MED ORDER — SIGHTPATH DOSE#1 NA HYALUR & NA CHOND-NA HYALUR IO KIT
PACK | INTRAOCULAR | Status: DC | PRN
  Administered 2023-12-24: 1 via OPHTHALMIC

## 2023-12-24 MED ORDER — PHENYLEPHRINE-KETOROLAC 1-0.3 % IO SOLN
INTRAOCULAR | Status: DC | PRN
  Administered 2023-12-24: 500 mL via OPHTHALMIC

## 2023-12-24 MED ORDER — TETRACAINE 0.5 % OP SOLN OPTIME - NO CHARGE
OPHTHALMIC | Status: DC | PRN
  Administered 2023-12-24: 1 [drp] via OPHTHALMIC

## 2023-12-24 MED ORDER — MOXIFLOXACIN HCL 5 MG/ML IO SOLN
INTRAOCULAR | Status: AC
Start: 2023-12-24 — End: ?
  Filled 2023-12-24: qty 1

## 2023-12-24 MED ORDER — SODIUM CHLORIDE 0.9% FLUSH: 3.0000 mL | INTRAVENOUS | Status: DC | PRN

## 2023-12-24 MED ORDER — SODIUM CHLORIDE 0.9% FLUSH: 3.0000 mL | Freq: Two times a day (BID) | INTRAVENOUS | Status: DC

## 2023-12-24 MED ORDER — DEXAMETHASONE 0.4 MG OP INST
VAGINAL_INSERT | OPHTHALMIC | Status: AC
  Filled 2023-12-24: qty 1

## 2023-12-24 MED ORDER — PHENYLEPHRINE HCL 2.5 % OP SOLN
1.0000 [drp] | OPHTHALMIC | Status: AC
  Administered 2023-12-24 (×3): 1 [drp] via OPHTHALMIC

## 2023-12-24 MED ORDER — DEXAMETHASONE 0.4 MG OP INST
VAGINAL_INSERT | OPHTHALMIC | Status: DC | PRN
  Administered 2023-12-24: .4 mg via OPHTHALMIC

## 2023-12-24 MED ORDER — LIDOCAINE HCL (PF) 1 % IJ SOLN
INTRAMUSCULAR | Status: DC | PRN
  Administered 2023-12-24: 1 mL

## 2023-12-24 MED ORDER — STERILE WATER FOR IRRIGATION IR SOLN
Status: DC | PRN
  Administered 2023-12-24: 250 mL

## 2023-12-24 MED ORDER — MIDAZOLAM HCL 2 MG/2ML IJ SOLN
INTRAMUSCULAR | Status: DC | PRN
  Administered 2023-12-24 (×2): 1 mg via INTRAVENOUS

## 2023-12-24 MED ORDER — MIDAZOLAM HCL 2 MG/2ML IJ SOLN
INTRAMUSCULAR | Status: AC
  Filled 2023-12-24: qty 2

## 2023-12-24 MED ORDER — MOXIFLOXACIN HCL 5 MG/ML IO SOLN
INTRAOCULAR | Status: DC | PRN
  Administered 2023-12-24: .3 mL via INTRACAMERAL

## 2023-12-24 MED ORDER — LIDOCAINE HCL 3.5 % OP GEL
1.0000 | Freq: Once | OPHTHALMIC | Status: AC
  Administered 2023-12-24: 1 via OPHTHALMIC

## 2023-12-24 MED ORDER — BSS IO SOLN
INTRAOCULAR | Status: DC | PRN
  Administered 2023-12-24: 15 mL via INTRAOCULAR

## 2023-12-24 MED ORDER — TETRACAINE HCL 0.5 % OP SOLN
1.0000 [drp] | OPHTHALMIC | Status: AC
  Administered 2023-12-24 (×3): 1 [drp] via OPHTHALMIC

## 2023-12-24 MED ORDER — PHENYLEPHRINE-KETOROLAC 1-0.3 % IO SOLN
INTRAOCULAR | Status: AC
  Filled 2023-12-24: qty 4

## 2023-12-24 MED ORDER — TROPICAMIDE 1 % OP SOLN
1.0000 [drp] | OPHTHALMIC | Status: AC
  Administered 2023-12-24 (×3): 1 [drp] via OPHTHALMIC

## 2023-12-24 MED ORDER — POVIDONE-IODINE 5 % OP SOLN
OPHTHALMIC | Status: DC | PRN
  Administered 2023-12-24: 1 via OPHTHALMIC

## 2023-12-24 SURGICAL SUPPLY — 14 items
CATARACT SUITE SIGHTPATH (MISCELLANEOUS) ×1 IMPLANT
CLOTH BEACON ORANGE TIMEOUT ST (SAFETY) ×1 IMPLANT
DRSG TEGADERM 4X4.75 (GAUZE/BANDAGES/DRESSINGS) ×1 IMPLANT
EYE SHIELD UNIVERSAL CLEAR (GAUZE/BANDAGES/DRESSINGS) IMPLANT
FEE CATARACT SUITE SIGHTPATH (MISCELLANEOUS) ×1 IMPLANT
GLOVE BIOGEL PI IND STRL 7.0 (GLOVE) ×2 IMPLANT
LENS IOL TECNIS EYHANCE 20.5 (Intraocular Lens) IMPLANT
NDL HYPO 18GX1.5 BLUNT FILL (NEEDLE) ×1 IMPLANT
NEEDLE HYPO 18GX1.5 BLUNT FILL (NEEDLE) ×1 IMPLANT
PAD ARMBOARD POSITIONER FOAM (MISCELLANEOUS) ×1 IMPLANT
POSITIONER HEAD 8X9X4 ADT (SOFTGOODS) ×1 IMPLANT
SYR TB 1ML LL NO SAFETY (SYRINGE) ×1 IMPLANT
TAPE SURG TRANSPORE 1 IN (GAUZE/BANDAGES/DRESSINGS) IMPLANT
WATER STERILE IRR 250ML POUR (IV SOLUTION) ×1 IMPLANT

## 2023-12-24 NOTE — Transfer of Care (Signed)
 Immediate Anesthesia Transfer of Care Note  Patient: Natasha [name]  Procedure(s) Performed: PHACOEMULSIFICATION, CATARACT, WITH IOL INSERTION (Right: Eye) INSERTION, STENT, DRUG-ELUTING, LACRIMAL CANALICULUS (Right: Eye)  Patient Location: PACU  Anesthesia Type:MAC  Level of Consciousness: awake, alert , oriented, and patient cooperative  Airway & Oxygen  Therapy: Patient Spontanous Breathing  Post-op Assessment: Report given to RN, Post -op Vital signs reviewed and stable, and Patient moving all extremities X 4  Post vital signs: Reviewed and stable  Last Vitals:  Vitals Value Taken Time  BP 136/72 12/24/23 0931  Temp 36.7 C 12/24/23 0931  Pulse 80 12/24/23 0931  Resp 20 12/24/23 0931  SpO2 100 % 12/24/23 0931    Last Pain:  Vitals:   12/24/23 0931  TempSrc: Oral  PainSc: 0-No pain         Complications: No notable events documented.

## 2023-12-24 NOTE — Anesthesia Preprocedure Evaluation (Signed)
 Anesthesia Evaluation  Patient identified by MRN, date of birth, ID band Patient awake    Reviewed: Allergy & Precautions, H&P , NPO status , Patient's Chart, lab work & pertinent test results, reviewed documented beta blocker date and time   Airway Mallampati: II  TM Distance: >3 FB Neck ROM: full    Dental no notable dental hx.    Pulmonary COPD, former smoker   Pulmonary exam normal breath sounds clear to auscultation       Cardiovascular Exercise Tolerance: Good hypertension,  Rhythm:regular Rate:Normal     Neuro/Psych negative neurological ROS  negative psych ROS   GI/Hepatic negative GI ROS, Neg liver ROS,,,  Endo/Other  negative endocrine ROS    Renal/GU negative Renal ROS  negative genitourinary   Musculoskeletal   Abdominal   Peds  Hematology negative hematology ROS (+)   Anesthesia Other Findings   Reproductive/Obstetrics negative OB ROS                             Anesthesia Physical Anesthesia Plan  ASA: 2  Anesthesia Plan: MAC   Post-op Pain Management:    Induction:   PONV Risk Score and Plan:   Airway Management Planned:   Additional Equipment:   Intra-op Plan:   Post-operative Plan:   Informed Consent: I have reviewed the patients History and Physical, chart, labs and discussed the procedure including the risks, benefits and alternatives for the proposed anesthesia with the patient or authorized representative who has indicated his/her understanding and acceptance.     Dental Advisory Given  Plan Discussed with: CRNA  Anesthesia Plan Comments:        Anesthesia Quick Evaluation

## 2023-12-24 NOTE — Anesthesia Postprocedure Evaluation (Signed)
 Anesthesia Post Note  Patient: Natasha [name]  Procedure(s) Performed: PHACOEMULSIFICATION, CATARACT, WITH IOL INSERTION (Right: Eye) INSERTION, STENT, DRUG-ELUTING, LACRIMAL CANALICULUS (Right: Eye)  Patient location during evaluation: Phase II Anesthesia Type: MAC Level of consciousness: awake Pain management: pain level controlled Vital Signs Assessment: post-procedure vital signs reviewed and stable Respiratory status: spontaneous breathing and respiratory function stable Cardiovascular status: blood pressure returned to baseline and stable Postop Assessment: no headache and no apparent nausea or vomiting Anesthetic complications: no Comments: Late entry   No notable events documented.   Last Vitals:  Vitals:   12/24/23 0808 12/24/23 0931  BP: 119/75 136/72  Pulse:  80  Resp: 20 20  Temp: 36.6 C 36.7 C  SpO2: 100% 100%    Last Pain:  Vitals:   12/24/23 0931  TempSrc: Oral  PainSc: 0-No pain                 Coretha Dew

## 2023-12-24 NOTE — Interval H&P Note (Signed)
 History and Physical Interval Note:  12/24/2023 9:02 AM  The H and P was reviewed and updated. The patient was examined.  No changes were found after exam.  The surgical eye was marked.     Natasha [name]

## 2023-12-24 NOTE — Discharge Instructions (Signed)
 Please discharge patient when stable, will follow up today with Dr. Ilsa Iha at the [facility] office immediately following discharge.  Leave shield in place until visit.  All paperwork with discharge instructions will be given at the office.  [facility] Address:  [address]  [place], [place] [address]  Dr. Chaya Jan Phone: [phone]

## 2023-12-24 NOTE — Op Note (Signed)
 Date of procedure: 12/24/23  Pre-operative diagnosis: Visually significant age-related nuclear cataract, Right Eye (H25.11)  Post-operative diagnosis: Visually significant age-related nuclear cataract, Right Eye  Procedure: Removal of cataract via phacoemulsification and insertion of intra-ocular lens J&J DIBOO +20.5D into the capsular bag of the Right Eye  Attending surgeon: Ardeth Krabbe, MD  Anesthesia: MAC, Topical Akten  Complications: None  Estimated Blood Loss: <34mL (minimal)  Specimens: None  Implants:  Implant Name Type Inv. Item Serial No. Manufacturer Lot No. LRB No. Used Action  LENS IOL TECNIS EYHANCE 20.5 - [id number] Intraocular Lens LENS IOL TECNIS EYHANCE 20.5 [id number] SIGHTPATH  Right 1 Implanted    Indications:  Visually significant age-related cataract, Right Eye  Procedure:  The patient was seen and identified in the pre-operative area. The operative eye was identified and dilated.  The operative eye was marked.  Topical anesthesia was administered to the operative eye.     The patient was then to the operative suite and placed in the supine position.  A timeout was performed confirming the patient, procedure to be performed, and all other relevant information.   The patient's face was prepped and draped in the usual fashion for intra-ocular surgery.  A lid speculum was placed into the operative eye and the surgical microscope moved into place and focused.  A superotemporal paracentesis was created using a 20 gauge paracentesis blade.  BSS mixed with Omidria , followed by 1% lidocaine  was injected into the anterior chamber.  Viscoelastic was injected into the anterior chamber.  A temporal clear-corneal main wound incision was created using a 2.39mm microkeratome.  A continuous curvilinear capsulorrhexis was initiated using an irrigating cystitome and completed using capsulorrhexis forceps.  Hydrodissection and hydrodeliniation were performed.  Viscoelastic was  injected into the anterior chamber.  A phacoemulsification handpiece and a chopper as a second instrument were used to remove the nucleus and epinucleus. The irrigation/aspiration handpiece was used to remove any remaining cortical material.   The capsular bag was reinflated with viscoelastic, checked, and found to be intact.  The intraocular lens was inserted into the capsular bag.  The irrigation/aspiration handpiece was used to remove any remaining viscoelastic.  The clear corneal wound and paracentesis wounds were then hydrated and checked with Weck-Cels to be watertight. Moxifloxacin  was instilled into the anterior chamber.  The lid-speculum and drape were removed. The lower punctum was dilated, and the dextenza  implant was inserted into it. The patient's face was cleaned with a wet and dry 4x4. A clear shield was taped over the eye. The patient was taken to the post-operative care unit in good condition, having tolerated the procedure well.  Post-Op Instructions: The patient will follow up at [facility] for a same day post-operative evaluation and will receive all other orders and instructions.

## 2023-12-27 ENCOUNTER — Encounter (HOSPITAL_COMMUNITY): Payer: Self-pay | Admitting: Optometry

## 2024-01-03 ENCOUNTER — Encounter (HOSPITAL_COMMUNITY): Payer: Self-pay

## 2024-01-03 ENCOUNTER — Other Ambulatory Visit: Payer: Self-pay

## 2024-01-04 ENCOUNTER — Encounter (HOSPITAL_COMMUNITY)
Admission: RE | Admit: 2024-01-04 | Discharge: 2024-01-04 | Disposition: A | Discharge status: Home / Self Care | Source: Ambulatory Visit | Attending: Optometry | Admitting: Optometry

## 2024-01-04 DIAGNOSIS — H2512 Age-related nuclear cataract, left eye: Secondary | ICD-10-CM | POA: Diagnosis not present

## 2024-01-04 NOTE — H&P (Signed)
 Surgical History & Physical  Patient Name: Natasha [name]  DOB: 1955/12/14  Surgery: Cataract extraction with intraocular lens implant phacoemulsification; Left Eye Surgeon: Ardeth Krabbe MD Surgery Date: 01/07/2024 Pre-Op Date: 12/29/2023  HPI: A 66 Yr. old female patient 1. The patient is returning after cataract surgery. The right eye is affected. Status post cataract surgery, which began 5 days ago: Since the last visit, the affected area feels improvement. The patient's vision is improved. The condition's severity is constant. Patient is following medication instructions. 2. The patient is returning for a cataract follow-up of the left eye. Since the last visit, the affected area is tolerating. The patient's vision is blurry. The condition's severity is constant. HPI was performed by Ardeth Krabbe .  Medical History: Glaucoma suspect  Heart Problem High Blood Pressure LDL Lung Problems Thyroid  Problems  Review of Systems Cardiovascular High Blood Pressure, A-fib Endocrine thyroid  disease Respiratory COPD All recorded systems are negative except as noted above.  Social Never smoked  Medication Prednisolon-moxiflox-bromf(pf),  Azithromycin, Amoxicillin-pot clavulanate, Alprazolam , Methylprednisolone , Alendronate, Rosuvastatin, Trelegy Ellipta, Paxlovid  Sx/Procedures Phaco c IOL OD - Dextenza ,  Hysterectomy  Drug Allergies  NKDA  History & Physical: Heent: cataract NECK: supple without bruits LUNGS: lungs clear to auscultation CV: regular rate and rhythm Abdomen: soft and non-tender  Impression & Plan: Assessment: 1.  CATARACT EXTRACTION STATUS; Right Eye (Z98.41) 2.  INTRAOCULAR LENS IOL ; Right Eye (Z96.1) 3.  CATARACT NUCLEAR SCLEROSIS AGE RELATED; Both Eyes (H25.13)  Plan: 1.  POD5 exam. Doing well. All post-op precautions discussed and instructions reviewed. Written instructions given.  2.   3.  Cataracts are visually significant and account for the  patient's complaints. Discussed all risks, benefits, procedures and recovery, including infection, loss of vision and eye, need for glasses after surgery or additional procedures. Patient understands changing glasses will not improve vision. Patient indicated understanding of procedure. All questions answered. Patient desires to have surgery, recommend phacoemulsification with intraocular lens. Patient to have preliminary testing necessary (Argos/IOL Master, Mac OCT, TOPO) Educational materials provided:Cataract.  Plan: - Proceed with cataract surgery OS when ready - Plan for best distance target with DIB00 - No DM, no fuchs, no prior eye surgery - Early AMD - good dilation - dextenza 

## 2024-01-07 ENCOUNTER — Ambulatory Visit (HOSPITAL_COMMUNITY): Admitting: Anesthesiology

## 2024-01-07 ENCOUNTER — Ambulatory Visit (HOSPITAL_COMMUNITY)
Admission: RE | Admit: 2024-01-07 | Discharge: 2024-01-07 | Disposition: A | Discharge status: Home / Self Care | Attending: Optometry | Admitting: Optometry

## 2024-01-07 ENCOUNTER — Encounter (HOSPITAL_COMMUNITY)
Admission: RE | Disposition: A | Discharge status: Home / Self Care | Payer: Self-pay | Source: Home / Self Care | Attending: Optometry

## 2024-01-07 DIAGNOSIS — I10 Essential (primary) hypertension: Secondary | ICD-10-CM | POA: Diagnosis not present

## 2024-01-07 DIAGNOSIS — H2512 Age-related nuclear cataract, left eye: Secondary | ICD-10-CM

## 2024-01-07 DIAGNOSIS — H5712 Ocular pain, left eye: Secondary | ICD-10-CM | POA: Diagnosis not present

## 2024-01-07 DIAGNOSIS — J449 Chronic obstructive pulmonary disease, unspecified: Secondary | ICD-10-CM | POA: Diagnosis not present

## 2024-01-07 DIAGNOSIS — Z87891 Personal history of nicotine dependence: Secondary | ICD-10-CM | POA: Diagnosis not present

## 2024-01-07 HISTORY — PX: INSERTION, STENT, DRUG-ELUTING, LACRIMAL CANALICULUS: SHX7453

## 2024-01-07 HISTORY — PX: CATARACT EXTRACTION W/PHACO: SHX586

## 2024-01-07 SURGERY — PHACOEMULSIFICATION, CATARACT, WITH IOL INSERTION
Anesthesia: Monitor Anesthesia Care | Site: Eye | Laterality: Left

## 2024-01-07 MED ORDER — MIDAZOLAM HCL 2 MG/2ML IJ SOLN
INTRAMUSCULAR | Status: AC
  Filled 2024-01-07: qty 2

## 2024-01-07 MED ORDER — MOXIFLOXACIN HCL 5 MG/ML IO SOLN
INTRAOCULAR | Status: DC | PRN
  Administered 2024-01-07: .2 mL via INTRACAMERAL

## 2024-01-07 MED ORDER — MIDAZOLAM HCL 2 MG/2ML IJ SOLN
INTRAMUSCULAR | Status: DC | PRN
  Administered 2024-01-07: 2 mg via INTRAVENOUS

## 2024-01-07 MED ORDER — DEXAMETHASONE 0.4 MG OP INST
VAGINAL_INSERT | OPHTHALMIC | Status: DC | PRN
  Administered 2024-01-07: .4 mg via OPHTHALMIC

## 2024-01-07 MED ORDER — TROPICAMIDE 1 % OP SOLN
1.0000 [drp] | OPHTHALMIC | Status: AC | PRN
  Administered 2024-01-07 (×3): 1 [drp] via OPHTHALMIC

## 2024-01-07 MED ORDER — BSS IO SOLN
INTRAOCULAR | Status: DC | PRN
  Administered 2024-01-07: 15 mL via INTRAOCULAR

## 2024-01-07 MED ORDER — SIGHTPATH DOSE#1 NA HYALUR & NA CHOND-NA HYALUR IO KIT
PACK | INTRAOCULAR | Status: DC | PRN
  Administered 2024-01-07: 1 via OPHTHALMIC

## 2024-01-07 MED ORDER — LIDOCAINE HCL (PF) 1 % IJ SOLN
INTRAMUSCULAR | Status: DC | PRN
  Administered 2024-01-07: 2 mL

## 2024-01-07 MED ORDER — STERILE WATER FOR IRRIGATION IR SOLN
Status: DC | PRN
  Administered 2024-01-07: 250 mL

## 2024-01-07 MED ORDER — LIDOCAINE HCL 3.5 % OP GEL
1.0000 | Freq: Once | OPHTHALMIC | Status: AC
  Administered 2024-01-07: 1 via OPHTHALMIC

## 2024-01-07 MED ORDER — SODIUM CHLORIDE 0.9% FLUSH
INTRAVENOUS | Status: DC | PRN
  Administered 2024-01-07: 10 mL via INTRAVENOUS

## 2024-01-07 MED ORDER — POVIDONE-IODINE 5 % OP SOLN
OPHTHALMIC | Status: DC | PRN
  Administered 2024-01-07: 1 via OPHTHALMIC

## 2024-01-07 MED ORDER — TETRACAINE HCL 0.5 % OP SOLN
1.0000 [drp] | OPHTHALMIC | Status: AC | PRN
  Administered 2024-01-07 (×3): 1 [drp] via OPHTHALMIC

## 2024-01-07 MED ORDER — DEXAMETHASONE 0.4 MG OP INST
VAGINAL_INSERT | OPHTHALMIC | Status: AC
  Filled 2024-01-07: qty 1

## 2024-01-07 MED ORDER — PHENYLEPHRINE HCL 2.5 % OP SOLN
1.0000 [drp] | OPHTHALMIC | Status: AC | PRN
  Administered 2024-01-07 (×3): 1 [drp] via OPHTHALMIC

## 2024-01-07 MED ORDER — PHENYLEPHRINE-KETOROLAC 1-0.3 % IO SOLN
INTRAOCULAR | Status: DC | PRN
  Administered 2024-01-07: 500 mL via OPHTHALMIC

## 2024-01-07 SURGICAL SUPPLY — 14 items
CATARACT SUITE SIGHTPATH (MISCELLANEOUS) ×2 IMPLANT
CLOTH BEACON ORANGE TIMEOUT ST (SAFETY) ×2 IMPLANT
DRSG TEGADERM 4X4.75 (GAUZE/BANDAGES/DRESSINGS) ×2 IMPLANT
EYE SHIELD UNIVERSAL CLEAR (GAUZE/BANDAGES/DRESSINGS) IMPLANT
FEE CATARACT SUITE SIGHTPATH (MISCELLANEOUS) ×2 IMPLANT
GLOVE BIOGEL PI IND STRL 7.0 (GLOVE) ×4 IMPLANT
LENS IOL TECNIS EYHANCE 20.5 (Intraocular Lens) IMPLANT
NDL HYPO 18GX1.5 BLUNT FILL (NEEDLE) ×2 IMPLANT
NEEDLE HYPO 18GX1.5 BLUNT FILL (NEEDLE) ×2 IMPLANT
PAD ARMBOARD POSITIONER FOAM (MISCELLANEOUS) ×2 IMPLANT
POSITIONER HEAD 8X9X4 ADT (SOFTGOODS) ×2 IMPLANT
SYR TB 1ML LL NO SAFETY (SYRINGE) ×2 IMPLANT
TAPE SURG TRANSPORE 1 IN (GAUZE/BANDAGES/DRESSINGS) IMPLANT
WATER STERILE IRR 250ML POUR (IV SOLUTION) ×2 IMPLANT

## 2024-01-07 NOTE — Op Note (Signed)
 Date of procedure: 01/07/24  Pre-operative diagnosis: Visually significant age-related nuclear cataract, Left Eye (H25.12)  Post-operative diagnosis: Visually significant age-related nuclear cataract, Left Eye H25.12; Ocular Pain and Inflammation, Left eye H57.12  Procedure: Removal of cataract via phacoemulsification and insertion of intra-ocular lens J&J DIB00 +20.5D into the capsular bag of the Left Eye  Attending surgeon: Gale Jude, MD  Anesthesia: MAC, Topical Akten   Complications: None  Estimated Blood Loss: <30mL (minimal)  Specimens: None  Implants:  Implant Name Type Inv. Item Serial No. Manufacturer Lot No. LRB No. Used Action  LENS IOL TECNIS EYHANCE 20.5 - [id number] Intraocular Lens LENS IOL TECNIS EYHANCE 20.5 [id number] SIGHTPATH  Left 1 Implanted    Indications:  Visually significant age-related cataract, Left Eye  Procedure:  The patient was seen and identified in the pre-operative area. The operative eye was identified and dilated.  The operative eye was marked.  Topical anesthesia was administered to the operative eye.     The patient was then to the operative suite and placed in the supine position.  A timeout was performed confirming the patient, procedure to be performed, and all other relevant information.   The patient's face was prepped and draped in the usual fashion for intra-ocular surgery.  A lid speculum was placed into the operative eye and the surgical microscope moved into place and focused.  An inferotemporal paracentesis was created using a 20 gauge paracentesis blade.  BSS mixed with Omidria , followed by 1% lidocaine  was injected into the anterior chamber.  Viscoelastic was injected into the anterior chamber.  A temporal clear-corneal main wound incision was created using a 2.35mm microkeratome.  A continuous curvilinear capsulorrhexis was initiated using an irrigating cystitome and completed using capsulorrhexis forceps.  Hydrodissection and  hydrodeliniation were performed.  Viscoelastic was injected into the anterior chamber.  A phacoemulsification handpiece and a chopper as a second instrument were used to remove the nucleus and epinucleus. The irrigation/aspiration handpiece was used to remove any remaining cortical material.   The capsular bag was reinflated with viscoelastic, checked, and found to be intact.  The intraocular lens was inserted into the capsular bag.  The irrigation/aspiration handpiece was used to remove any remaining viscoelastic.  The clear corneal wound and paracentesis wounds were then hydrated and checked with Weck-Cels to be watertight. Moxifloxacin  was instilled into the anterior chamber.  The lid-speculum and drape were removed. The lower punctum was dilated, and the dextenza  implant was inserted into it. The patient's face was cleaned with a wet and dry 4x4.  A clear shield was taped over the eye. The patient was taken to the post-operative care unit in good condition, having tolerated the procedure well.  Post-Op Instructions: The patient will follow up at [facility] for a same day post-operative evaluation and will receive all other orders and instructions.

## 2024-01-07 NOTE — Anesthesia Preprocedure Evaluation (Signed)
 Anesthesia Evaluation  Patient identified by MRN, date of birth, ID band Patient awake    Reviewed: Allergy & Precautions, H&P , NPO status , Patient's Chart, lab work & pertinent test results, reviewed documented beta blocker date and time   Airway Mallampati: II  TM Distance: >3 FB Neck ROM: full    Dental no notable dental hx.    Pulmonary COPD, former smoker   Pulmonary exam normal breath sounds clear to auscultation       Cardiovascular Exercise Tolerance: Good hypertension,  Rhythm:regular Rate:Normal     Neuro/Psych negative neurological ROS  negative psych ROS   GI/Hepatic negative GI ROS, Neg liver ROS,,,  Endo/Other  negative endocrine ROS    Renal/GU negative Renal ROS  negative genitourinary   Musculoskeletal   Abdominal   Peds  Hematology negative hematology ROS (+)   Anesthesia Other Findings   Reproductive/Obstetrics negative OB ROS                             Anesthesia Physical Anesthesia Plan  ASA: 2  Anesthesia Plan: MAC   Post-op Pain Management:    Induction:   PONV Risk Score and Plan:   Airway Management Planned:   Additional Equipment:   Intra-op Plan:   Post-operative Plan:   Informed Consent: I have reviewed the patients History and Physical, chart, labs and discussed the procedure including the risks, benefits and alternatives for the proposed anesthesia with the patient or authorized representative who has indicated his/her understanding and acceptance.     Dental Advisory Given  Plan Discussed with: CRNA  Anesthesia Plan Comments:        Anesthesia Quick Evaluation

## 2024-01-07 NOTE — Interval H&P Note (Signed)
 History and Physical Interval Note:  01/07/2024 11:10 AM  The H and P was reviewed and updated. The patient was examined.  No changes were found after exam.  The surgical eye was marked.  Natasha [name]

## 2024-01-07 NOTE — Discharge Instructions (Signed)
 Please discharge patient when stable, will follow up today with Dr. Ilsa Iha at the [facility] office immediately following discharge.  Leave shield in place until visit.  All paperwork with discharge instructions will be given at the office.  [facility] Address:  [address]  [place], [place] [address]  Dr. Chaya Jan Phone: [phone]

## 2024-01-07 NOTE — Anesthesia Postprocedure Evaluation (Signed)
 Anesthesia Post Note  Patient: Natasha [name]  Procedure(s) Performed: PHACOEMULSIFICATION, CATARACT, WITH IOL INSERTION (Left: Eye) INSERTION, STENT, DRUG-ELUTING, LACRIMAL CANALICULUS (Left)  Patient location during evaluation: Phase II Anesthesia Type: MAC Level of consciousness: awake Pain management: pain level controlled Vital Signs Assessment: post-procedure vital signs reviewed and stable Respiratory status: spontaneous breathing and respiratory function stable Cardiovascular status: blood pressure returned to baseline and stable Postop Assessment: no headache and no apparent nausea or vomiting Anesthetic complications: no Comments: Late entry   No notable events documented.   Last Vitals:  Vitals:   01/07/24 1100 01/07/24 1211  BP:  117/87  Pulse: 78 81  Resp: 16 18  Temp:  36.7 C  SpO2: 99% 100%    Last Pain:  Vitals:   01/07/24 1211  TempSrc: Oral  PainSc: 0-No pain                 Coretha Dew

## 2024-01-07 NOTE — Transfer of Care (Signed)
 Immediate Anesthesia Transfer of Care Note  Patient: Natasha [name]  Procedure(s) Performed: PHACOEMULSIFICATION, CATARACT, WITH IOL INSERTION (Left: Eye) INSERTION, STENT, DRUG-ELUTING, LACRIMAL CANALICULUS (Left)  Patient Location: Short Stay  Anesthesia Type:MAC  Level of Consciousness: awake, alert , oriented, and patient cooperative  Airway & Oxygen  Therapy: Patient Spontanous Breathing  Post-op Assessment: Report given to RN, Post -op Vital signs reviewed and stable, and Patient moving all extremities X 4  Post vital signs: Reviewed and stable  Last Vitals:  Vitals Value Taken Time  BP    Temp    Pulse    Resp    SpO2      Last Pain:  Vitals:   01/07/24 1058  PainSc: 0-No pain         Complications: No notable events documented.

## 2024-01-10 ENCOUNTER — Encounter (HOSPITAL_COMMUNITY): Payer: Self-pay | Admitting: Optometry

## 2024-01-26 DIAGNOSIS — J329 Chronic sinusitis, unspecified: Secondary | ICD-10-CM | POA: Diagnosis not present

## 2024-01-26 DIAGNOSIS — J449 Chronic obstructive pulmonary disease, unspecified: Secondary | ICD-10-CM | POA: Diagnosis not present

## 2024-01-26 DIAGNOSIS — J9611 Chronic respiratory failure with hypoxia: Secondary | ICD-10-CM | POA: Diagnosis not present

## 2024-01-26 DIAGNOSIS — Z681 Body mass index (BMI) 19 or less, adult: Secondary | ICD-10-CM | POA: Diagnosis not present

## 2024-01-26 DIAGNOSIS — N390 Urinary tract infection, site not specified: Secondary | ICD-10-CM | POA: Diagnosis not present

## 2024-01-26 DIAGNOSIS — I10 Essential (primary) hypertension: Secondary | ICD-10-CM | POA: Diagnosis not present

## 2024-01-29 DIAGNOSIS — I10 Essential (primary) hypertension: Secondary | ICD-10-CM | POA: Diagnosis not present

## 2024-05-08 DIAGNOSIS — Z87891 Personal history of nicotine dependence: Secondary | ICD-10-CM | POA: Diagnosis not present

## 2024-05-08 DIAGNOSIS — Z681 Body mass index (BMI) 19 or less, adult: Secondary | ICD-10-CM | POA: Diagnosis not present

## 2024-05-08 DIAGNOSIS — R300 Dysuria: Secondary | ICD-10-CM | POA: Diagnosis not present

## 2024-05-08 DIAGNOSIS — Z713 Dietary counseling and surveillance: Secondary | ICD-10-CM | POA: Diagnosis not present

## 2024-05-08 DIAGNOSIS — J449 Chronic obstructive pulmonary disease, unspecified: Secondary | ICD-10-CM | POA: Diagnosis not present

## 2024-05-08 DIAGNOSIS — J069 Acute upper respiratory infection, unspecified: Secondary | ICD-10-CM | POA: Diagnosis not present

## 2024-05-08 DIAGNOSIS — Z7951 Long term (current) use of inhaled steroids: Secondary | ICD-10-CM | POA: Diagnosis not present

## 2024-05-08 DIAGNOSIS — R030 Elevated blood-pressure reading, without diagnosis of hypertension: Secondary | ICD-10-CM | POA: Diagnosis not present

## 2024-05-22 ENCOUNTER — Inpatient Hospital Stay (HOSPITAL_COMMUNITY)
Admission: EM | Admit: 2024-05-22 | Discharge: 2024-05-27 | DRG: 189 | Disposition: A | Discharge status: Home / Self Care | Attending: Internal Medicine | Admitting: Internal Medicine

## 2024-05-22 ENCOUNTER — Emergency Department (HOSPITAL_COMMUNITY)

## 2024-05-22 ENCOUNTER — Encounter (HOSPITAL_COMMUNITY): Payer: Self-pay

## 2024-05-22 ENCOUNTER — Other Ambulatory Visit: Payer: Self-pay

## 2024-05-22 DIAGNOSIS — J441 Chronic obstructive pulmonary disease with (acute) exacerbation: Secondary | ICD-10-CM | POA: Diagnosis not present

## 2024-05-22 DIAGNOSIS — J449 Chronic obstructive pulmonary disease, unspecified: Secondary | ICD-10-CM | POA: Diagnosis present

## 2024-05-22 DIAGNOSIS — F419 Anxiety disorder, unspecified: Secondary | ICD-10-CM | POA: Diagnosis present

## 2024-05-22 DIAGNOSIS — I4719 Other supraventricular tachycardia: Secondary | ICD-10-CM | POA: Diagnosis present

## 2024-05-22 DIAGNOSIS — R918 Other nonspecific abnormal finding of lung field: Secondary | ICD-10-CM | POA: Diagnosis not present

## 2024-05-22 DIAGNOSIS — J9621 Acute and chronic respiratory failure with hypoxia: Secondary | ICD-10-CM | POA: Diagnosis not present

## 2024-05-22 DIAGNOSIS — E782 Mixed hyperlipidemia: Secondary | ICD-10-CM | POA: Diagnosis not present

## 2024-05-22 DIAGNOSIS — J9601 Acute respiratory failure with hypoxia: Secondary | ICD-10-CM | POA: Diagnosis present

## 2024-05-22 DIAGNOSIS — Z87891 Personal history of nicotine dependence: Secondary | ICD-10-CM

## 2024-05-22 DIAGNOSIS — Z7983 Long term (current) use of bisphosphonates: Secondary | ICD-10-CM

## 2024-05-22 DIAGNOSIS — Z1152 Encounter for screening for COVID-19: Secondary | ICD-10-CM | POA: Diagnosis not present

## 2024-05-22 DIAGNOSIS — Z79899 Other long term (current) drug therapy: Secondary | ICD-10-CM | POA: Diagnosis not present

## 2024-05-22 DIAGNOSIS — M810 Age-related osteoporosis without current pathological fracture: Secondary | ICD-10-CM | POA: Diagnosis not present

## 2024-05-22 DIAGNOSIS — I10 Essential (primary) hypertension: Secondary | ICD-10-CM | POA: Diagnosis present

## 2024-05-22 DIAGNOSIS — R0600 Dyspnea, unspecified: Secondary | ICD-10-CM | POA: Diagnosis not present

## 2024-05-22 DIAGNOSIS — Z90710 Acquired absence of both cervix and uterus: Secondary | ICD-10-CM

## 2024-05-22 LAB — RESPIRATORY PANEL BY PCR

## 2024-05-22 LAB — BASIC METABOLIC PANEL WITH GFR
Anion gap: 12 (ref 5–15)
BUN: 13 mg/dL (ref 8–23)
CO2: 23 mmol/L (ref 22–32)
Calcium: 8.7 mg/dL — ABNORMAL LOW (ref 8.9–10.3)
Chloride: 102 mmol/L (ref 98–111)
Creatinine, Ser: 0.45 mg/dL (ref 0.44–1.00)
GFR, Estimated: >60 mL/min (ref >60)
Glucose, Bld: 85 mg/dL (ref 70–99)
Potassium: 4.4 mmol/L (ref 3.5–5.1)
Sodium: 137 mmol/L (ref 135–145)

## 2024-05-22 LAB — CBC WITH DIFFERENTIAL/PLATELET
Abs Immature Granulocytes: 0.01 K/uL (ref 0.00–0.07)
Basophils Absolute: 0 K/uL (ref 0.0–0.1)
Basophils Relative: 1 %
Eosinophils Absolute: 0.1 K/uL (ref 0.0–0.5)
Eosinophils Relative: 2 %
HCT: 37.9 % (ref 36.0–46.0)
Hemoglobin: 12 g/dL (ref 12.0–15.0)
Immature Granulocytes: 0 %
Lymphocytes Relative: 14 %
Lymphs Abs: 0.5 K/uL — ABNORMAL LOW (ref 0.7–4.0)
MCH: 31.8 pg (ref 26.0–34.0)
MCHC: 31.7 g/dL (ref 30.0–36.0)
MCV: 100.5 fL — ABNORMAL HIGH (ref 80.0–100.0)
Monocytes Absolute: 0.4 K/uL (ref 0.1–1.0)
Monocytes Relative: 9 %
Neutro Abs: 2.8 K/uL (ref 1.7–7.7)
Neutrophils Relative %: 74 %
Platelets: 216 K/uL (ref 150–400)
RBC: 3.77 MIL/uL — ABNORMAL LOW (ref 3.87–5.11)
RDW: 13.9 % (ref 11.5–15.5)
WBC: 3.8 K/uL — ABNORMAL LOW (ref 4.0–10.5)
nRBC: 0 % (ref 0.0–0.2)

## 2024-05-22 LAB — RESP PANEL BY RT-PCR (RSV, FLU A&B, COVID)  RVPGX2
Influenza A by PCR: NEGATIVE
Influenza B by PCR: NEGATIVE
Resp Syncytial Virus by PCR: NEGATIVE
SARS Coronavirus 2 by RT PCR: NEGATIVE

## 2024-05-22 LAB — D-DIMER, QUANTITATIVE: D-Dimer, Quant: 0.41 ug{FEU}/mL (ref 0.00–0.50)

## 2024-05-22 LAB — BRAIN NATRIURETIC PEPTIDE: B Natriuretic Peptide: 31 pg/mL (ref 0.0–100.0)

## 2024-05-22 MED ORDER — ARFORMOTEROL TARTRATE 15 MCG/2ML IN NEBU
15.0000 ug | INHALATION_SOLUTION | Freq: Two times a day (BID) | RESPIRATORY_TRACT | Status: DC
  Administered 2024-05-22 – 2024-05-24 (×3): 15 ug via RESPIRATORY_TRACT
  Filled 2024-05-22 (×4): qty 2

## 2024-05-22 MED ORDER — HEPARIN SODIUM (PORCINE) 5000 UNIT/ML IJ SOLN
5000.0000 [IU] | Freq: Three times a day (TID) | INTRAMUSCULAR | Status: DC
  Administered 2024-05-22 – 2024-05-27 (×13): 5000 [IU] via SUBCUTANEOUS
  Filled 2024-05-22 (×13): qty 1

## 2024-05-22 MED ORDER — ALPRAZOLAM 0.5 MG PO TABS
1.0000 mg | ORAL_TABLET | Freq: Four times a day (QID) | ORAL | Status: DC | PRN
  Administered 2024-05-22 – 2024-05-27 (×10): 1 mg via ORAL
  Filled 2024-05-22: qty 2
  Filled 2024-05-22: qty 1
  Filled 2024-05-22 (×7): qty 2
  Filled 2024-05-22: qty 1

## 2024-05-22 MED ORDER — ORAL CARE MOUTH RINSE: 15.0000 mL | OROMUCOSAL | Status: DC | PRN

## 2024-05-22 MED ORDER — IPRATROPIUM-ALBUTEROL 0.5-2.5 (3) MG/3ML IN SOLN
3.0000 mL | Freq: Once | RESPIRATORY_TRACT | Status: AC
  Administered 2024-05-22: 3 mL via RESPIRATORY_TRACT
  Filled 2024-05-22: qty 3

## 2024-05-22 MED ORDER — ENOXAPARIN SODIUM 40 MG/0.4ML IJ SOSY
40.0000 mg | PREFILLED_SYRINGE | INTRAMUSCULAR | Status: DC
Start: 2024-05-22 — End: 2024-05-22

## 2024-05-22 MED ORDER — IPRATROPIUM-ALBUTEROL 0.5-2.5 (3) MG/3ML IN SOLN: 3.0000 mL | Freq: Four times a day (QID) | RESPIRATORY_TRACT | Status: DC | PRN

## 2024-05-22 MED ORDER — ACETAMINOPHEN 325 MG PO TABS
650.0000 mg | ORAL_TABLET | Freq: Four times a day (QID) | ORAL | Status: DC | PRN
  Administered 2024-05-22 – 2024-05-27 (×11): 650 mg via ORAL
  Filled 2024-05-22 (×12): qty 2

## 2024-05-22 MED ORDER — BUDESONIDE 0.5 MG/2ML IN SUSP
0.5000 mg | Freq: Two times a day (BID) | RESPIRATORY_TRACT | Status: DC
  Administered 2024-05-22 – 2024-05-27 (×9): 0.5 mg via RESPIRATORY_TRACT
  Filled 2024-05-22 (×10): qty 2

## 2024-05-22 MED ORDER — ACETAMINOPHEN 650 MG RE SUPP: 650.0000 mg | Freq: Four times a day (QID) | RECTAL | Status: DC | PRN

## 2024-05-22 MED ORDER — ROSUVASTATIN CALCIUM 10 MG PO TABS
10.0000 mg | ORAL_TABLET | Freq: Every day | ORAL | Status: DC
  Administered 2024-05-23 – 2024-05-27 (×5): 10 mg via ORAL
  Filled 2024-05-22 (×5): qty 1

## 2024-05-22 MED ORDER — ONDANSETRON HCL 4 MG/2ML IJ SOLN: 4.0000 mg | Freq: Four times a day (QID) | INTRAMUSCULAR | Status: DC | PRN

## 2024-05-22 MED ORDER — METHYLPREDNISOLONE SODIUM SUCC 125 MG IJ SOLR
125.0000 mg | Freq: Once | INTRAMUSCULAR | Status: AC
  Administered 2024-05-22: 125 mg via INTRAVENOUS
  Filled 2024-05-22: qty 2

## 2024-05-22 MED ORDER — IPRATROPIUM-ALBUTEROL 0.5-2.5 (3) MG/3ML IN SOLN
3.0000 mL | Freq: Four times a day (QID) | RESPIRATORY_TRACT | Status: DC
Start: 2024-05-22 — End: 2024-05-22
  Administered 2024-05-22 (×2): 3 mL via RESPIRATORY_TRACT
  Filled 2024-05-22 (×2): qty 3

## 2024-05-22 MED ORDER — ONDANSETRON HCL 4 MG PO TABS: 4.0000 mg | ORAL_TABLET | Freq: Four times a day (QID) | ORAL | Status: DC | PRN

## 2024-05-22 MED ORDER — IPRATROPIUM-ALBUTEROL 0.5-2.5 (3) MG/3ML IN SOLN
3.0000 mL | Freq: Three times a day (TID) | RESPIRATORY_TRACT | Status: DC
  Administered 2024-05-23: 3 mL via RESPIRATORY_TRACT
  Filled 2024-05-22: qty 3

## 2024-05-22 MED ORDER — METHYLPREDNISOLONE SODIUM SUCC 125 MG IJ SOLR
60.0000 mg | Freq: Two times a day (BID) | INTRAMUSCULAR | Status: DC
  Administered 2024-05-22 – 2024-05-24 (×4): 60 mg via INTRAVENOUS
  Filled 2024-05-22 (×4): qty 2

## 2024-05-22 NOTE — ED Notes (Signed)
 ED Provider at bedside.

## 2024-05-22 NOTE — Hospital Course (Signed)
 68 year old female with a history of hypertension, COPD, chronic respiratory failure on 2 L as needed, hyperlipidemia, anxiety presenting with 1 month history of coughing, shortness of breath, chest congestion.  The patient states that she has seen her PCP and initially was placed on Augmentin and a steroid taper.  She had some temporary relief.  Her shortness of breath came back.  She was placed on doxycycline and another steroid taper.  Although she had some relief, her overall shortness of breath continue to worsen with dyspnea on exertion and coughing.  She quit smoking 10 years ago after about a 20-pack-year history.  She denies any nausea, vomiting or diarrhea, abdominal pain, dysuria, hematuria.  She denies any chest pain.  She has a nonproductive cough.  She denies any hemoptysis.  She denies any worsening lower extremity edema orthopnea. In the ED, the patient was afebrile and hemodynamically stable with oxygen  saturation 98% on 2 L.  WBC 3.8, hemoglobin 12.0, platelets 216.  Sodium 137, potassium 4.4, bicarbonate 23, serum creatinine 0.45.  EKG showed sinus tachycardia without concerning ST/T wave changes.  D-dimer 0.41.  BNP 31.  COVID-19 PCR is negative.  Chest x-ray was negative for infiltrates.  Showed hyperinflation.  The patient was given DuoNebs and Solu-Medrol .  She was admitted for further evaluation and treatment of her respiratory failure.

## 2024-05-22 NOTE — H&P (Signed)
 History and Physical    Patient: Natasha [name] FMW:[id number] DOB: 1956-04-15 DOA: 05/22/2024 DOS: the patient was seen and examined on 05/22/2024 PCP: Shona Arthea DASEN, DO  Patient coming from: Home  Chief Complaint:  Chief Complaint  Patient presents with   Shortness of Breath   HPI: Natasha [name] is a 68 year old female with a history of hypertension, COPD, chronic respiratory failure on 2 L as needed, hyperlipidemia, anxiety presenting with 1 month history of coughing, shortness of breath, chest congestion.  The patient states that she has seen her PCP and initially was placed on Augmentin and a steroid taper.  She had some temporary relief.  Her shortness of breath came back.  She was placed on doxycycline and another steroid taper.  Although she had some relief, her overall shortness of breath continue to worsen with dyspnea on exertion and coughing.  She quit smoking 10 years ago after about a 20-pack-year history.  She denies any nausea, vomiting or diarrhea, abdominal pain, dysuria, hematuria.  She denies any chest pain.  She has a nonproductive cough.  She denies any hemoptysis.  She denies any worsening lower extremity edema orthopnea. In the ED, the patient was afebrile and hemodynamically stable with oxygen  saturation 98% on 2 L.  WBC 3.8, hemoglobin 12.0, platelets 216.  Sodium 137, potassium 4.4, bicarbonate 23, serum creatinine 0.45.  EKG showed sinus tachycardia without concerning ST/T wave changes.  D-dimer 0.41.  BNP 31.  COVID-19 PCR is negative.  Chest x-ray was negative for infiltrates.  Showed hyperinflation.  The patient was given DuoNebs and Solu-Medrol .  She was admitted for further evaluation and treatment of her respiratory failure.  Review of Systems: As mentioned in the history of present illness. All other systems reviewed and are negative. Past Medical History:  Diagnosis Date   Arthritis    osteoporosis   COPD (chronic obstructive pulmonary disease) (HCC)    HTN  (hypertension)    Past Surgical History:  Procedure Laterality Date   ABDOMINAL HYSTERECTOMY     CATARACT EXTRACTION W/PHACO Right 12/24/2023   Procedure: PHACOEMULSIFICATION, CATARACT, WITH IOL INSERTION;  Surgeon: Juli Blunt, MD;  Location: [facility];  Service: Ophthalmology;  Laterality: Right;  CDE: 3.14   CATARACT EXTRACTION W/PHACO Left 01/07/2024   Procedure: PHACOEMULSIFICATION, CATARACT, WITH IOL INSERTION;  Surgeon: Juli Blunt, MD;  Location: [facility];  Service: Ophthalmology;  Laterality: Left;  CDE: 2.88   INSERTION, STENT, DRUG-ELUTING, LACRIMAL CANALICULUS Right 12/24/2023   Procedure: INSERTION, STENT, DRUG-ELUTING, LACRIMAL CANALICULUS;  Surgeon: Juli Blunt, MD;  Location: [facility];  Service: Ophthalmology;  Laterality: Right;   INSERTION, STENT, DRUG-ELUTING, LACRIMAL CANALICULUS Left 01/07/2024   Procedure: INSERTION, STENT, DRUG-ELUTING, LACRIMAL CANALICULUS;  Surgeon: Juli Blunt, MD;  Location: [facility];  Service: Ophthalmology;  Laterality: Left;   LEEP     Social History:  reports that she quit smoking about 10 years ago. Her smoking use included cigarettes. She does not have any smokeless tobacco history on file. She reports that she does not drink alcohol and does not use drugs.  No Known Allergies  History reviewed. No pertinent family history.  Prior to Admission medications   Medication Sig Start Date End Date Taking? Authorizing Provider  albuterol  (PROVENTIL  HFA;VENTOLIN  HFA) 108 (90 BASE) MCG/ACT inhaler Inhale 1 puff into the lungs every 4 (four) hours as needed for wheezing or shortness of breath. 12/20/13  Yes Black, Darice HERO, NP  albuterol  (PROVENTIL ) (2.5 MG/3ML) 0.083% nebulizer solution Take 3 mLs (2.5 mg  total) by nebulization every 6 (six) hours as needed for wheezing or shortness of breath. 12/20/13  Yes Black, Darice HERO, NP  ALPRAZolam  (XANAX ) 1 MG tablet Take 1 mg by mouth every 6 (six) hours as needed for anxiety.   Yes [provider]  Fluticasone-Umeclidin-Vilant (TRELEGY ELLIPTA) 100-62.5-25 MCG/ACT AEPB Inhale 100 mg into the lungs daily.   Yes [provider]  ipratropium (ATROVENT ) 0.02 % nebulizer solution Take 2.5 mLs (0.5 mg total) by nebulization every 6 (six) hours as needed for wheezing or shortness of breath. 12/20/13  Yes Black, Darice HERO, NP  rosuvastatin  (CRESTOR ) 10 MG tablet Take 10 mg by mouth daily.   Yes [provider]  alendronate (FOSAMAX) 70 MG/75ML solution Take 70 mg by mouth every 7 (seven) days. Take with a full glass of water  on an empty stomach.    [provider]    Physical Exam: Vitals:   05/22/24 0930 05/22/24 1030 05/22/24 1130 05/22/24 1158  BP: (!) 172/81 (!) 167/73 (!) 142/73 (!) 140/75  Pulse: (!) 104 (!) 101 (!) 101   Resp: (!) 33 (!) 31 (!) 22 (!) 22  Temp:      TempSrc:      SpO2: 98% 98% 96% 93%  Weight:      Height:       GENERAL:  A&O x 3, NAD, well developed, cooperative, follows commands HEENT: [place]/AT, No thrush, No icterus, No oral ulcers Neck:  No neck mass, No meningismus, soft, supple CV: RRR, no S3, no S4, no rub, no JVD Lungs:  mild exp wheeze. Diminished BA.  Bibasilar rales Abd: soft/NT +BS, nondistended Ext: No edema, no lymphangitis, no cyanosis, no rashes Neuro:  CN II-XII intact, strength 4/5 in RUE, RLE, strength 4/5 LUE, LLE; sensation intact bilateral; no dysmetria; babinski equivocal  Data Reviewed: Data reviewed in history  Assessment and Plan: Acute on chronic respiratory failure with hypoxia -Normally on 2 L as needed at home -wean oxygen  for saturation >92> - Secondary COPD exacerbation  COPD exacerbation -start brovana  -start pulmicort  -continue IV solumedrol -continue duonebs  Anxiety -continue xanax  -PDMP reviewed -xanax  1 mg, #90, refilled 04/28/24  Mixed Hyperlipidemia -continue crestor     Advance Care Planning: FULL  Consults: none  Family Communication: son 9/22  Severity of Illness: The  appropriate patient status for this patient is OBSERVATION. Observation status is judged to be reasonable and necessary in order to provide the required intensity of service to ensure the patient's safety. The patient's presenting symptoms, physical exam findings, and initial radiographic and laboratory data in the context of their medical condition is felt to place them at decreased risk for further clinical deterioration. Furthermore, it is anticipated that the patient will be medically stable for discharge from the hospital within 2 midnights of admission.   Author: Alm Schneider, MD 05/22/2024 12:19 PM  For on call review [email or web address].

## 2024-05-22 NOTE — Progress Notes (Signed)
 Received call from central tele that pt's HR was sustaining in 150's SVT. In room to find pt sitting up on side of bed. A&O x4. Pt denies any c/o chest pain or SOB, states just finished nebulizer tx and did ambulate to restroom. Lungs sounds diminished bilaterally, RR 21 but unlabored. Skin warm and dry, color appropriate. Pt states HR always goes up with the nebulizer and it makes her very shaky. Pt's SaO2 88% but O2 on 2 lpm [place]. O2 increased to 3 lom [place] and SaO2 up to 94%.  As pt rested on side of bed, HR slowly coming down, currently 132 with RRR and only c/o now is leg cramps. Primary nurse aware and attempting to get pt med for leg cramps.   05/22/24 2018  Vitals  BP (!) 167/94  MAP (mmHg) 114  BP Location Right Arm  BP Method Automatic  Patient Position (if appropriate) Sitting  Pulse Rate (!) 146  Pulse Rate Source Dinam[place]  Resp (!) 21  Level of Consciousness  Level of Consciousness Alert  MEWS COLOR  MEWS Score Color Red  Oxygen  Therapy  SpO2 (!) 89 %  O2 Device Nasal Cannula  O2 Flow Rate (L/min) 2 L/min  MEWS Score  MEWS Temp 0  MEWS Systolic 0  MEWS Pulse 3  MEWS RR 1  MEWS LOC 0  MEWS Score 4

## 2024-05-22 NOTE — Plan of Care (Signed)
   Problem: Education: Goal: Knowledge of General Education information will improve Description: Including pain rating scale, medication(s)/side effects and non-pharmacologic comfort measures Outcome: Progressing   Problem: Clinical Measurements: Goal: Ability to maintain clinical measurements within normal limits will improve Outcome: Progressing Goal: Diagnostic test results will improve Outcome: Progressing

## 2024-05-22 NOTE — ED Provider Notes (Signed)
 [place] EMERGENCY DEPARTMENT AT [facility] Provider Note   CSN: [id number] Arrival date & time: 05/22/24  9193     Patient presents with: Shortness of Breath   Natasha [name] is a 68 y.o. female. History of hypertension and COPD.  Uses Trelegy daily and is on 2 L as needed mostly when exerting herself.  For the past month she has had increased shortness of breath.  She has been to her PCP multiple times and states she has been Augmentin twice and doxycycline twice including 2 burst of steroids.  She states she feels better on the medicine but starts having shortness of breath again when the prednisone  runs out.  She has had mild cough that she feels is from postnasal drip, denies any sputum production, no fever or chills.  No chest pain, no nausea or vomiting.  No lower extremity swelling or pain, no history of cancer or blood clots.  Has been having to wear her oxygen  more consistently since this started, even at rest. She does not currently smoke.    Shortness of Breath      Prior to Admission medications   Medication Sig Start Date End Date Taking? Authorizing Provider  albuterol  (PROVENTIL  HFA;VENTOLIN  HFA) 108 (90 BASE) MCG/ACT inhaler Inhale 1 puff into the lungs every 4 (four) hours as needed for wheezing or shortness of breath. 12/20/13   Black, Darice HERO, NP  albuterol  (PROVENTIL ) (2.5 MG/3ML) 0.083% nebulizer solution Take 3 mLs (2.5 mg total) by nebulization every 6 (six) hours as needed for wheezing or shortness of breath. 12/20/13   Black, Darice HERO, NP  alendronate (FOSAMAX) 70 MG/75ML solution Take 70 mg by mouth every 7 (seven) days. Take with a full glass of water  on an empty stomach.    [provider]  ALPRAZolam  (XANAX ) 1 MG tablet Take 1 mg by mouth every 6 (six) hours as needed for anxiety.    [provider]  Fluticasone-Umeclidin-Vilant (TRELEGY ELLIPTA) 100-62.5-25 MCG/ACT AEPB Inhale 100 mg into the lungs daily.    [provider]  ipratropium (ATROVENT ) 0.02 % nebulizer solution Take 2.5 mLs (0.5 mg total) by nebulization every 6 (six) hours as needed for wheezing or shortness of breath. 12/20/13   Black, Darice HERO, NP  rosuvastatin  (CRESTOR ) 10 MG tablet Take 10 mg by mouth daily.    [provider]    Allergies: Patient has no known allergies.    Review of Systems  Respiratory:  Positive for shortness of breath.     Updated Vital Signs There were no vitals taken for this visit.  Physical Exam Vitals and nursing note reviewed.  Constitutional:      General: She is not in acute distress.    Appearance: She is well-developed.  HENT:     Head: Normocephalic and atraumatic.  Eyes:     Extraocular Movements: Extraocular movements intact.     Conjunctiva/sclera: Conjunctivae normal.     Pupils: Pupils are equal, round, and reactive to light.  Cardiovascular:     Rate and Rhythm: Regular rhythm. Tachycardia present.     Heart sounds: No murmur heard. Pulmonary:     Effort: Pulmonary effort is normal. No respiratory distress.     Breath sounds: Examination of the right-upper field reveals decreased breath sounds. Examination of the left-upper field reveals decreased breath sounds. Examination of the right-middle field reveals decreased breath sounds. Examination of the left-middle field reveals decreased breath sounds. Examination of the right-lower field reveals decreased  breath sounds. Examination of the left-lower field reveals decreased breath sounds and wheezing. Decreased breath sounds and wheezing present. No rhonchi or rales.  Abdominal:     Palpations: Abdomen is soft.     Tenderness: There is no abdominal tenderness.  Musculoskeletal:        General: No swelling.     Cervical back: Normal range of motion and neck supple.     Right lower leg: No tenderness. No edema.     Left lower leg: No tenderness. No edema.  Skin:    General: Skin is warm and dry.     Capillary Refill: Capillary refill  takes less than 2 seconds.  Neurological:     Mental Status: She is alert.  Psychiatric:        Mood and Affect: Mood normal.     (all labs ordered are listed, but only abnormal results are displayed) Labs Reviewed - No data to display  EKG: None  Radiology: No results found.   Procedures   Medications Ordered in the ED - No data to display                                  Medical Decision Making This patient presents to the ED for concern of events of breath x 1 month, this involves an extensive number of treatment options, and is a complaint that carries with it a high risk of complications and morbidity.  The differential diagnosis includes exacerbation, pneumonia, PE, heart failure exacerbation, pleural effusion, pneumothorax, other   Co morbidities that complicate the patient evaluation :   COPD   Additional history obtained:  Additional history obtained from EMR External records from outside source obtained and reviewed including prior notes and labs   Lab Tests:  I Ordered, and personally interpreted labs.  The pertinent results include: CBC with mild leukopenia, no anemia negative RSV COVID and flu, D-dimer negative, BNP normal, BMP normal   Imaging Studies ordered:  I ordered imaging studies including chest x-ray which shows hyperinflation, no pulmonary edema or infiltrate I independently visualized and interpreted imaging within scope of identifying emergent findings  I agree with the radiologist interpretation   Cardiac Monitoring: / EKG:  The patient was maintained on a cardiac monitor.  I personally viewed and interpreted the cardiac monitored which showed an underlying rhythm of: Sinus tachycardia   Consultations Obtained:  I requested consultation with the specialist Dr. Evonnie,  and discussed lab and imaging findings as well as pertinent plan - they recommend: Admission   Problem List / ED Course / Critical interventions / Medication  management  Patient with worsening shortness of breath wheezing cough over the past month, has had steroids x 2 also reports she has been on doxycycline and Augmentin.  That she is when the steroid stopped her symptoms come back.  She is now having to use her oxygen  24/7, she got several DuoNebs and steroids here in the ER and had some mild improvement but still gets very tachycardic when she has to talk or move.  Otherwise her workup was reassuring including negative D-dimer ruling out PE as a cause of shortness of breath and tachycardia.  Discussed with patient discharge with close outpatient follow-up and steroids versus admission due to increased oxygen .  Patient will prefer to stay.  Discussed with hospitalist as above for admission. I ordered medication including DuoNeb and Solu-Medrol  for COPD exacerbation Reevaluation of the  patient after these medicines showed that the patient improved I have reviewed the patients home medicines and have made adjustments as needed   Social Determinants of Health:  Patient lives independently    Amount and/or Complexity of Data Reviewed Labs: ordered. Radiology: ordered.  Risk Prescription drug management. Decision regarding hospitalization.        Final diagnoses:  None    ED Discharge Orders     None          Suellen Sherran DELENA DEVONNA 05/22/24 1142    Bernard Drivers, MD 05/23/24 1537

## 2024-05-22 NOTE — ED Notes (Signed)
 Patient transported to X-ray

## 2024-05-22 NOTE — ED Triage Notes (Signed)
 Pt reports SHOB for over a month. Pt wears 2L of oxygen  at baseline for COPD PRN and pt states having to use oxygen  daily for the past month. Pt has seen PCP and been on 4 different antibiotics for a diagnosis of sinus infection and given steroids. EDP at bedside during triage.

## 2024-05-23 DIAGNOSIS — J9601 Acute respiratory failure with hypoxia: Secondary | ICD-10-CM

## 2024-05-23 DIAGNOSIS — I4719 Other supraventricular tachycardia: Secondary | ICD-10-CM

## 2024-05-23 DIAGNOSIS — J441 Chronic obstructive pulmonary disease with (acute) exacerbation: Secondary | ICD-10-CM | POA: Diagnosis not present

## 2024-05-23 LAB — MRSA NEXT GEN BY PCR, NASAL: MRSA by PCR Next Gen: NOT DETECTED

## 2024-05-23 LAB — BASIC METABOLIC PANEL WITH GFR
Anion gap: 10 (ref 5–15)
BUN: 16 mg/dL (ref 8–23)
CO2: 28 mmol/L (ref 22–32)
Calcium: 9.3 mg/dL (ref 8.9–10.3)
Chloride: 104 mmol/L (ref 98–111)
Creatinine, Ser: 0.38 mg/dL — ABNORMAL LOW (ref 0.44–1.00)
GFR, Estimated: >60 mL/min (ref >60)
Glucose, Bld: 140 mg/dL — ABNORMAL HIGH (ref 70–99)
Potassium: 3.9 mmol/L (ref 3.5–5.1)
Sodium: 142 mmol/L (ref 135–145)

## 2024-05-23 LAB — HIV ANTIBODY (ROUTINE TESTING W REFLEX): HIV Screen 4th Generation wRfx: NONREACTIVE

## 2024-05-23 LAB — MAGNESIUM: Magnesium: 2 mg/dL (ref 1.7–2.4)

## 2024-05-23 MED ORDER — LEVALBUTEROL HCL 0.63 MG/3ML IN NEBU
0.6300 mg | INHALATION_SOLUTION | Freq: Three times a day (TID) | RESPIRATORY_TRACT | Status: DC
  Administered 2024-05-23 – 2024-05-24 (×3): 0.63 mg via RESPIRATORY_TRACT
  Filled 2024-05-23 (×3): qty 3

## 2024-05-23 MED ORDER — CHLORHEXIDINE GLUCONATE CLOTH 2 % EX PADS
6.0000 | MEDICATED_PAD | Freq: Every day | CUTANEOUS | Status: DC
  Administered 2024-05-24 – 2024-05-27 (×4): 6 via TOPICAL

## 2024-05-23 MED ORDER — IPRATROPIUM BROMIDE 0.02 % IN SOLN
0.5000 mg | Freq: Three times a day (TID) | RESPIRATORY_TRACT | Status: DC
  Administered 2024-05-23 – 2024-05-24 (×3): 0.5 mg via RESPIRATORY_TRACT
  Filled 2024-05-23 (×3): qty 2.5

## 2024-05-23 MED ORDER — DILTIAZEM LOAD VIA INFUSION
10.0000 mg | Freq: Once | INTRAVENOUS | Status: AC
  Administered 2024-05-23: 10 mg via INTRAVENOUS
  Filled 2024-05-23: qty 10

## 2024-05-23 MED ORDER — LACTATED RINGERS IV BOLUS
1000.0000 mL | Freq: Once | INTRAVENOUS | Status: AC
  Administered 2024-05-23: 1000 mL via INTRAVENOUS

## 2024-05-23 MED ORDER — DILTIAZEM HCL-DEXTROSE 125-5 MG/125ML-% IV SOLN (PREMIX)
5.0000 mg/h | INTRAVENOUS | Status: DC
  Administered 2024-05-23: 5 mg/h via INTRAVENOUS
  Filled 2024-05-23 (×2): qty 125

## 2024-05-23 NOTE — Care Management Obs Status (Signed)
 MEDICARE OBSERVATION STATUS NOTIFICATION   Patient Details  Name: Natasha [name] MRN: [id number] Date of Birth: 05/08/1956   Medicare Observation Status Notification Given:  Yes    [name] 05/23/2024, 12:02 PM

## 2024-05-23 NOTE — Progress Notes (Signed)
 HR 130-150's sustained, pt c/o feeling like my heart is racing MD notified, orders received.

## 2024-05-23 NOTE — Progress Notes (Signed)
 PROGRESS NOTE  Natasha [name] FMW:[id number] DOB: 10-04-1955 DOA: 05/22/2024 PCP: Shona Arthea DASEN, DO  Brief History:  68 year old female with a history of hypertension, COPD, chronic respiratory failure on 2 L as needed, hyperlipidemia, anxiety presenting with 1 month history of coughing, shortness of breath, chest congestion.  The patient states that she has seen her PCP and initially was placed on Augmentin and a steroid taper.  She had some temporary relief.  Her shortness of breath came back.  She was placed on doxycycline and another steroid taper.  Although she had some relief, her overall shortness of breath continue to worsen with dyspnea on exertion and coughing.  She quit smoking 10 years ago after about a 20-pack-year history.  She denies any nausea, vomiting or diarrhea, abdominal pain, dysuria, hematuria.  She denies any chest pain.  She has a nonproductive cough.  She denies any hemoptysis.  She denies any worsening lower extremity edema orthopnea. In the ED, the patient was afebrile and hemodynamically stable with oxygen  saturation 98% on 2 L.  WBC 3.8, hemoglobin 12.0, platelets 216.  Sodium 137, potassium 4.4, bicarbonate 23, serum creatinine 0.45.  EKG showed sinus tachycardia without concerning ST/T wave changes.  D-dimer 0.41.  BNP 31.  COVID-19 PCR is negative.  Chest x-ray was negative for infiltrates.  Showed hyperinflation.  The patient was given DuoNebs and Solu-Medrol .  She was admitted for further evaluation and treatment of her respiratory failure.   Assessment/Plan: Acute on chronic respiratory failure with hypoxia -Normally on 2 L as needed at home -wean oxygen  for saturation >92% - Secondary COPD exacerbation - COVID/RSV/Flu--neg - viral respiratory panel--neg   COPD exacerbation -Continue brovana  -Continue pulmicort  -continue IV solumedrol -continue duonebs -clinically improving   Anxiety -continue xanax  -PDMP reviewed -xanax  1 mg, #90, refilled  04/28/24   Mixed Hyperlipidemia -continue crestor    Sinus tachycardia -in part due to BDs -pt is asymptomatic -give 1L bolus LR -check TSH        Family Communication:  no Family at bedside  Consultants:  none  Code Status:  FULL   DVT Prophylaxis:  Hart Heparin     Procedures: As Listed in Progress Note Above  Antibiotics: None     Subjective: Pt states sob is improving, but still has some dyspnea on exertion.  Denies cp, n/v/d, abd pain, f/c  Objective: Vitals:   05/22/24 2022 05/23/24 0301 05/23/24 0721 05/23/24 1331  BP:  (!) 113/58  133/65  Pulse: (!) 136 90  (!) 108  Resp:  16  18  Temp: 97.8 F (36.6 C) (!) 97.5 F (36.4 C)  97.7 F (36.5 C)  TempSrc: Oral Oral    SpO2: 92% 100% 98% 97%  Weight:      Height:        Intake/Output Summary (Last 24 hours) at 05/23/2024 1633 Last data filed at 05/23/2024 0800 Gross per 24 hour  Intake 240 ml  Output --  Net 240 ml   Weight change:  Exam:  General:  Pt is alert, follows commands appropriately, not in acute distress HEENT: No icterus, No thrush, No neck mass, [place]/AT Cardiovascular: RRR, S1/S2, no rubs, no gallops Respiratory: bibasilar rales. Diminished BS Abdomen: Soft/+BS, non tender, non distended, no guarding Extremities: No edema, No lymphangitis, No petechiae, No rashes, no synovitis   Data Reviewed: I have personally reviewed following labs and imaging studies Basic Metabolic Panel: Recent Labs  Lab 05/22/24 0826 05/23/24 0436  NA 137 142  K 4.4 3.9  CL 102 104  CO2 23 28  GLUCOSE 85 140*  BUN 13 16  CREATININE 0.45 0.38*  CALCIUM  8.7* 9.3  MG  --  2.0   Liver Function Tests: No results for input(s): AST, ALT, ALKPHOS, BILITOT, PROT, ALBUMIN in the last 168 hours. No results for input(s): LIPASE, AMYLASE in the last 168 hours. No results for input(s): AMMONIA in the last 168 hours. Coagulation Profile: No results for input(s): INR, PROTIME in the last  168 hours. CBC: Recent Labs  Lab 05/22/24 0950  WBC 3.8*  NEUTROABS 2.8  HGB 12.0  HCT 37.9  MCV 100.5*  PLT 216   Cardiac Enzymes: No results for input(s): CKTOTAL, CKMB, CKMBINDEX, TROPONINI in the last 168 hours. BNP: Invalid input(s): POCBNP CBG: No results for input(s): GLUCAP in the last 168 hours. HbA1C: No results for input(s): HGBA1C in the last 72 hours. Urine analysis: No results found for: COLORURINE, APPEARANCEUR, LABSPEC, PHURINE, GLUCOSEU, HGBUR, BILIRUBINUR, KETONESUR, PROTEINUR, UROBILINOGEN, NITRITE, LEUKOCYTESUR Sepsis Labs: @LABRCNTIP (procalcitonin:4,lacticidven:4) ) Recent Results (from the past 240 hours)  Resp panel by RT-PCR (RSV, Flu A&B, Covid) Anterior Nasal Swab     Status: None   Collection Time: 05/22/24  8:29 AM   Specimen: Anterior Nasal Swab  Result Value Ref Range Status   SARS Coronavirus 2 by RT PCR NEGATIVE NEGATIVE Final    Comment: (NOTE) SARS-CoV-2 target nucleic acids are NOT DETECTED.  The SARS-CoV-2 RNA is generally detectable in upper respiratory specimens during the acute phase of infection. The lowest concentration of SARS-CoV-2 viral copies this assay can detect is 138 copies/mL. A negative result does not preclude SARS-Cov-2 infection and should not be used as the sole basis for treatment or other patient management decisions. A negative result may occur with  improper specimen collection/handling, submission of specimen other than nasopharyngeal swab, presence of viral mutation(s) within the areas targeted by this assay, and inadequate number of viral copies(<138 copies/mL). A negative result must be combined with clinical observations, patient history, and epidemiological information. The expected result is Negative.  Fact Sheet for Patients:  BloggerCourse.com  Fact Sheet for Healthcare Providers:  SeriousBroker.it  This test is  no t yet approved or cleared by the [place]  FDA and  has been authorized for detection and/or diagnosis of SARS-CoV-2 by FDA under an Emergency Use Authorization (EUA). This EUA will remain  in effect (meaning this test can be used) for the duration of the COVID-19 declaration under Section 564(b)(1) of the Act, 21 U.S.C.section 360bbb-3(b)(1), unless the authorization is terminated  or revoked sooner.       Influenza A by PCR NEGATIVE NEGATIVE Final   Influenza B by PCR NEGATIVE NEGATIVE Final    Comment: (NOTE) The Xpert Xpress SARS-CoV-2/FLU/RSV plus assay is intended as an aid in the diagnosis of influenza from Nasopharyngeal swab specimens and should not be used as a sole basis for treatment. Nasal washings and aspirates are unacceptable for Xpert Xpress SARS-CoV-2/FLU/RSV testing.  Fact Sheet for Patients: BloggerCourse.com  Fact Sheet for Healthcare Providers: SeriousBroker.it  This test is not yet approved or cleared by the [place]  FDA and has been authorized for detection and/or diagnosis of SARS-CoV-2 by FDA under an Emergency Use Authorization (EUA). This EUA will remain in effect (meaning this test can be used) for the duration of the COVID-19 declaration under Section 564(b)(1) of the Act, 21 U.S.C. section 360bbb-3(b)(1), unless the authorization is terminated or revoked.  Resp Syncytial Virus by PCR NEGATIVE NEGATIVE Final    Comment: (NOTE) Fact Sheet for Patients: BloggerCourse.com  Fact Sheet for Healthcare Providers: SeriousBroker.it  This test is not yet approved or cleared by the [place]  FDA and has been authorized for detection and/or diagnosis of SARS-CoV-2 by FDA under an Emergency Use Authorization (EUA). This EUA will remain in effect (meaning this test can be used) for the duration of the COVID-19 declaration under Section  564(b)(1) of the Act, 21 U.S.C. section 360bbb-3(b)(1), unless the authorization is terminated or revoked.  Performed at [facility], [address] [place], [place] [address]   Respiratory (~20 pathogens) panel by PCR     Status: None   Collection Time: 05/22/24 12:50 PM   Specimen: Nasopharyngeal Swab; Respiratory  Result Value Ref Range Status   Adenovirus NOT DETECTED NOT DETECTED Final   Coronavirus 229E NOT DETECTED NOT DETECTED Final    Comment: (NOTE) The Coronavirus on the Respiratory Panel, DOES NOT test for the novel  Coronavirus (2019 nCoV)    Coronavirus HKU1 NOT DETECTED NOT DETECTED Final   Coronavirus NL63 NOT DETECTED NOT DETECTED Final   Coronavirus OC43 NOT DETECTED NOT DETECTED Final   Metapneumovirus NOT DETECTED NOT DETECTED Final   Rhinovirus / Enterovirus NOT DETECTED NOT DETECTED Final   Influenza A NOT DETECTED NOT DETECTED Final   Influenza B NOT DETECTED NOT DETECTED Final   Parainfluenza Virus 1 NOT DETECTED NOT DETECTED Final   Parainfluenza Virus 2 NOT DETECTED NOT DETECTED Final   Parainfluenza Virus 3 NOT DETECTED NOT DETECTED Final   Parainfluenza Virus 4 NOT DETECTED NOT DETECTED Final   Respiratory Syncytial Virus NOT DETECTED NOT DETECTED Final   Bordetella pertussis NOT DETECTED NOT DETECTED Final   Bordetella Parapertussis NOT DETECTED NOT DETECTED Final   Chlamydophila pneumoniae NOT DETECTED NOT DETECTED Final   Mycoplasma pneumoniae NOT DETECTED NOT DETECTED Final    Comment: Performed at [facility], [address] [place], [place] [address]     Scheduled Meds:  arformoterol   15 mcg Nebulization BID   budesonide  (PULMICORT ) nebulizer solution  0.5 mg Nebulization BID   heparin  injection (subcutaneous)  5,000 Units Subcutaneous Q8H   ipratropium  0.5 mg Nebulization Q8H   levalbuterol   0.63 mg Nebulization Q8H   methylPREDNISolone  (SOLU-MEDROL ) injection  60 mg Intravenous Q12H   rosuvastatin   10 mg Oral Daily   Continuous  Infusions:  Procedures/Studies: DG Chest 2 View Result Date: 05/22/2024 CLINICAL DATA:  Dyspnea EXAM: CHEST - 2 VIEW COMPARISON:  12/18/2013 chest radiograph. FINDINGS: Stable cardiomediastinal silhouette with normal heart size. No pneumothorax. No pleural effusion. Hyperinflated lungs. No pulmonary edema. No acute consolidative airspace disease. Similar minimal curvilinear scarring at the right costophrenic angle. Lower thoracic vertebral compression fracture is new from 2015. IMPRESSION: 1. Hyperinflated lungs, suggesting COPD. No acute cardiopulmonary disease. 2. Lower thoracic vertebral compression fracture, new from 25, age indeterminate. Electronically Signed   By: Selinda DELENA Blue M.D.   On: 05/22/2024 09:40    Alm Schneider, DO  [facility]  If 7PM-7AM, please contact night-coverage [email or web address] Password [facility] 05/23/2024, 4:33 PM   LOS: 0 days

## 2024-05-23 NOTE — Plan of Care (Signed)

## 2024-05-23 NOTE — Progress Notes (Signed)
 Responded to nursing call:  tachycardia, pt feels heart racing   Subjective: Pt feels heart racing, denies cp.  States breathing no labored but sob going to bathroom.  Denies f/c, dizziness  Vitals:   05/23/24 0301 05/23/24 0721 05/23/24 1331 05/23/24 1726  BP: (!) 113/58  133/65 (!) 170/86  Pulse: 90  (!) 108 96  Resp: 16  18 (!) 22  Temp: (!) 97.5 F (36.4 C)  97.7 F (36.5 C) 98 F (36.7 C)  TempSrc: Oral   Oral  SpO2: 100% 98% 97% 96%  Weight:      Height:       CV--RRR, tachycardia Lung--diminished BS, scattered rales Abd--soft+BS/NT   Assessment/Plan: Atrial tachycardia/MAT -personally reviewed EKG--atrial tach -personally reviewed telemetry--MAT, multiform p-waves -start diltiazem  -transfer to SDU -TSH -Echo -family updated   The patient is critically ill with multiple organ systems failure and requires high complexity decision making for assessment and support, frequent evaluation and titration of therapies, application of advanced monitoring technologies and extensive interpretation of multiple databases.  Critical care time - 31 mins.     Alm Schneider, DO [facility]

## 2024-05-23 NOTE — Progress Notes (Signed)
   05/23/24 1240  TOC Brief Assessment  Insurance and Status Reviewed  Patient has primary care physician Yes  Home environment has been reviewed Home with Children  Prior/Current Home Services No current home services  Social Drivers of Health Review SDOH reviewed no interventions necessary  Readmission risk has been reviewed Yes  Transition of care needs no transition of care needs at this time   Transition of Care Department [facility]) has reviewed patient and no TOC needs have been identified at this time. We will continue to monitor patient advancement through interdisciplinary progression rounds. If new patient transition needs arise, please place a TOC consult.

## 2024-05-24 ENCOUNTER — Other Ambulatory Visit (HOSPITAL_COMMUNITY): Payer: Self-pay | Admitting: *Deleted

## 2024-05-24 ENCOUNTER — Inpatient Hospital Stay (HOSPITAL_COMMUNITY)

## 2024-05-24 DIAGNOSIS — J9621 Acute and chronic respiratory failure with hypoxia: Secondary | ICD-10-CM | POA: Diagnosis not present

## 2024-05-24 DIAGNOSIS — Z79899 Other long term (current) drug therapy: Secondary | ICD-10-CM | POA: Diagnosis not present

## 2024-05-24 DIAGNOSIS — E782 Mixed hyperlipidemia: Secondary | ICD-10-CM | POA: Diagnosis not present

## 2024-05-24 DIAGNOSIS — I4719 Other supraventricular tachycardia: Secondary | ICD-10-CM

## 2024-05-24 DIAGNOSIS — I10 Essential (primary) hypertension: Secondary | ICD-10-CM | POA: Diagnosis not present

## 2024-05-24 DIAGNOSIS — M810 Age-related osteoporosis without current pathological fracture: Secondary | ICD-10-CM | POA: Diagnosis not present

## 2024-05-24 DIAGNOSIS — R0602 Shortness of breath: Secondary | ICD-10-CM

## 2024-05-24 DIAGNOSIS — Z87891 Personal history of nicotine dependence: Secondary | ICD-10-CM | POA: Diagnosis not present

## 2024-05-24 DIAGNOSIS — J9601 Acute respiratory failure with hypoxia: Secondary | ICD-10-CM | POA: Diagnosis not present

## 2024-05-24 DIAGNOSIS — Z7983 Long term (current) use of bisphosphonates: Secondary | ICD-10-CM | POA: Diagnosis not present

## 2024-05-24 DIAGNOSIS — F419 Anxiety disorder, unspecified: Secondary | ICD-10-CM | POA: Diagnosis present

## 2024-05-24 DIAGNOSIS — J449 Chronic obstructive pulmonary disease, unspecified: Secondary | ICD-10-CM | POA: Diagnosis not present

## 2024-05-24 DIAGNOSIS — J441 Chronic obstructive pulmonary disease with (acute) exacerbation: Secondary | ICD-10-CM | POA: Diagnosis not present

## 2024-05-24 DIAGNOSIS — Z1152 Encounter for screening for COVID-19: Secondary | ICD-10-CM | POA: Diagnosis not present

## 2024-05-24 DIAGNOSIS — Z90710 Acquired absence of both cervix and uterus: Secondary | ICD-10-CM | POA: Diagnosis not present

## 2024-05-24 LAB — BASIC METABOLIC PANEL WITH GFR
Anion gap: 12 (ref 5–15)
BUN: 21 mg/dL (ref 8–23)
CO2: 28 mmol/L (ref 22–32)
Calcium: 9.2 mg/dL (ref 8.9–10.3)
Chloride: 103 mmol/L (ref 98–111)
Creatinine, Ser: 0.42 mg/dL — ABNORMAL LOW (ref 0.44–1.00)
GFR, Estimated: >60 mL/min (ref >60)
Glucose, Bld: 125 mg/dL — ABNORMAL HIGH (ref 70–99)
Potassium: 4.1 mmol/L (ref 3.5–5.1)
Sodium: 143 mmol/L (ref 135–145)

## 2024-05-24 LAB — CBC
HCT: 33.1 % — ABNORMAL LOW (ref 36.0–46.0)
Hemoglobin: 10.6 g/dL — ABNORMAL LOW (ref 12.0–15.0)
MCH: 32 pg (ref 26.0–34.0)
MCHC: 32 g/dL (ref 30.0–36.0)
MCV: 100 fL (ref 80.0–100.0)
Platelets: 270 K/uL (ref 150–400)
RBC: 3.31 MIL/uL — ABNORMAL LOW (ref 3.87–5.11)
RDW: 14.2 % (ref 11.5–15.5)
WBC: 13.5 K/uL — ABNORMAL HIGH (ref 4.0–10.5)
nRBC: 0 % (ref 0.0–0.2)

## 2024-05-24 LAB — ECHOCARDIOGRAM COMPLETE
Area-P 1/2: 4.21 cm2
Height: 61 in
S' Lateral: 2.2 cm
Weight: 1298.07 [oz_av]

## 2024-05-24 LAB — TSH: TSH: 0.172 u[IU]/mL — ABNORMAL LOW (ref 0.350–4.500)

## 2024-05-24 LAB — MAGNESIUM: Magnesium: 1.9 mg/dL (ref 1.7–2.4)

## 2024-05-24 LAB — T4, FREE: Free T4: 0.96 ng/dL (ref 0.61–1.12)

## 2024-05-24 MED ORDER — METHYLPREDNISOLONE SODIUM SUCC 40 MG IJ SOLR
40.0000 mg | Freq: Two times a day (BID) | INTRAMUSCULAR | Status: DC
  Administered 2024-05-24 – 2024-05-25 (×2): 40 mg via INTRAVENOUS
  Filled 2024-05-24 (×2): qty 1

## 2024-05-24 MED ORDER — LEVALBUTEROL HCL 0.63 MG/3ML IN NEBU
0.6300 mg | INHALATION_SOLUTION | Freq: Four times a day (QID) | RESPIRATORY_TRACT | Status: DC | PRN
  Administered 2024-05-24 – 2024-05-25 (×2): 0.63 mg via RESPIRATORY_TRACT
  Filled 2024-05-24 (×2): qty 3

## 2024-05-24 MED ORDER — SERTRALINE HCL 50 MG PO TABS
25.0000 mg | ORAL_TABLET | Freq: Every day | ORAL | Status: DC
Start: 2024-05-24 — End: 2024-05-27
  Administered 2024-05-24 – 2024-05-27 (×4): 25 mg via ORAL
  Filled 2024-05-24 (×4): qty 1

## 2024-05-24 MED ORDER — PERFLUTREN LIPID MICROSPHERE
1.0000 mL | INTRAVENOUS | Status: AC | PRN
  Administered 2024-05-24: 3 mL via INTRAVENOUS

## 2024-05-24 MED ORDER — HYDRALAZINE HCL 20 MG/ML IJ SOLN
10.0000 mg | INTRAMUSCULAR | Status: DC | PRN
Start: 2024-05-24 — End: 2024-05-27
  Administered 2024-05-24 – 2024-05-25 (×2): 10 mg via INTRAVENOUS
  Filled 2024-05-24 (×2): qty 1

## 2024-05-24 NOTE — Plan of Care (Signed)
   Problem: Coping: Goal: Level of anxiety will decrease Outcome: Progressing   Problem: Pain Managment: Goal: General experience of comfort will improve and/or be controlled Outcome: Progressing   Problem: Safety: Goal: Ability to remain free from injury will improve Outcome: Progressing

## 2024-05-24 NOTE — Progress Notes (Signed)
*  PRELIMINARY RESULTS* Echocardiogram 2D Echocardiogram has been performed with Definity .  Natasha [name] 05/24/2024, 4:32 PM

## 2024-05-24 NOTE — Progress Notes (Signed)
 PROGRESS NOTE    Natasha [name]  FMW:[id number] DOB: 04/05/1956 DOA: 05/22/2024 PCP: Shona Arthea DASEN, DO   Brief Narrative:    68 year old female with a history of hypertension, COPD, chronic respiratory failure on 2 L as needed, hyperlipidemia, anxiety presenting with 1 month history of coughing, shortness of breath, chest congestion.  She was admitted for acute on chronic hypoxemic respiratory failure secondary to acute COPD exacerbation and was started on IV steroids as well as breathing treatments.  She developed palpitations on 9/23 and was thought to be in atrial tachycardia and was started on IV Cardizem  drip, however it appears that she has remained in sinus tachycardia likely as a result of her ongoing treatment of her respiratory issues.  Assessment & Plan:   Principal Problem:   Acute respiratory failure with hypoxia (HCC) Active Problems:   COPD exacerbation (HCC)   Acute on chronic respiratory failure with hypoxia (HCC)   Mixed hyperlipidemia   Multifocal atrial tachycardia  Assessment and Plan:   Acute on chronic respiratory failure with hypoxia -Normally on 2 L as needed at home -wean oxygen  for saturation >92% - Secondary COPD exacerbation - COVID/RSV/Flu--neg - viral respiratory panel--neg   COPD exacerbation-improving -Continue brovana  -Continue pulmicort  -continue IV solumedrol, decrease dosage to 40 twice daily -continue on Xopenex  only -clinically improving   Anxiety -continue xanax  -PDMP reviewed -xanax  1 mg, #90, refilled 04/28/24   Mixed Hyperlipidemia -continue crestor    Sinus tachycardia -in part due to BDs, switch to Xopenex  only as needed - Discontinue Cardizem  drip - TSH low, but free T4 appropriate    DVT prophylaxis: Heparin  Code Status: Full Family Communication: Daughter on phone 9/24 Disposition Plan:  Status is: Inpatient Remains inpatient appropriate because: Continues on IV medications   Consultants:  None  Procedures:   None  Antimicrobials:  None   Subjective: Patient seen and evaluated today with improved shortness of breath and no further palpitations.  Heart rates remain elevated in the low 100 range.  No chest tightness or wheezing noted.  Objective: Vitals:   05/24/24 0600 05/24/24 0700 05/24/24 0739 05/24/24 1124  BP: (!) 130/53 (!) 143/66    Pulse: 79 100    Resp: 19 (!) 21    Temp:   98.2 F (36.8 C) 98 F (36.7 C)  TempSrc:   Oral Axillary  SpO2: 98% 96%    Weight:      Height:        Intake/Output Summary (Last 24 hours) at 05/24/2024 1252 Last data filed at 05/24/2024 0839 Gross per 24 hour  Intake 306.95 ml  Output 450 ml  Net -143.05 ml   Filed Weights   05/22/24 0823 05/23/24 2000  Weight: 35.8 kg 36.8 kg    Examination:  General exam: Appears calm and comfortable  Respiratory system: Clear to auscultation. Respiratory effort normal.  Nasal cannula oxygen  Cardiovascular system: S1 & S2 heard, RRR.  Tachycardic Gastrointestinal system: Abdomen is soft Central nervous system: Alert and awake Extremities: No edema Skin: No significant lesions noted Psychiatry: Flat affect.    Data Reviewed: I have personally reviewed following labs and imaging studies  CBC: Recent Labs  Lab 05/22/24 0950 05/24/24 0446  WBC 3.8* 13.5*  NEUTROABS 2.8  --   HGB 12.0 10.6*  HCT 37.9 33.1*  MCV 100.5* 100.0  PLT 216 270   Basic Metabolic Panel: Recent Labs  Lab 05/22/24 0826 05/23/24 0436 05/24/24 0446  NA 137 142 143  K 4.4 3.9 4.1  CL  102 104 103  CO2 23 28 28   GLUCOSE 85 140* 125*  BUN 13 16 21   CREATININE 0.45 0.38* 0.42*  CALCIUM  8.7* 9.3 9.2  MG  --  2.0 1.9   GFR: Estimated Creatinine Clearance: 38.6 mL/min (A) (by C-G formula based on SCr of 0.42 mg/dL (L)). Liver Function Tests: No results for input(s): AST, ALT, ALKPHOS, BILITOT, PROT, ALBUMIN in the last 168 hours. No results for input(s): LIPASE, AMYLASE in the last 168 hours. No  results for input(s): AMMONIA in the last 168 hours. Coagulation Profile: No results for input(s): INR, PROTIME in the last 168 hours. Cardiac Enzymes: No results for input(s): CKTOTAL, CKMB, CKMBINDEX, TROPONINI in the last 168 hours. BNP (last 3 results) No results for input(s): PROBNP in the last 8760 hours. HbA1C: No results for input(s): HGBA1C in the last 72 hours. CBG: No results for input(s): GLUCAP in the last 168 hours. Lipid Profile: No results for input(s): CHOL, HDL, LDLCALC, TRIG, CHOLHDL, LDLDIRECT in the last 72 hours. Thyroid  Function Tests: Recent Labs    05/24/24 0446  TSH 0.172*  FREET4 0.96   Anemia Panel: No results for input(s): VITAMINB12, FOLATE, FERRITIN, TIBC, IRON, RETICCTPCT in the last 72 hours. Sepsis Labs: No results for input(s): PROCALCITON, LATICACIDVEN in the last 168 hours.  Recent Results (from the past 240 hours)  Resp panel by RT-PCR (RSV, Flu A&B, Covid) Anterior Nasal Swab     Status: None   Collection Time: 05/22/24  8:29 AM   Specimen: Anterior Nasal Swab  Result Value Ref Range Status   SARS Coronavirus 2 by RT PCR NEGATIVE NEGATIVE Final    Comment: (NOTE) SARS-CoV-2 target nucleic acids are NOT DETECTED.  The SARS-CoV-2 RNA is generally detectable in upper respiratory specimens during the acute phase of infection. The lowest concentration of SARS-CoV-2 viral copies this assay can detect is 138 copies/mL. A negative result does not preclude SARS-Cov-2 infection and should not be used as the sole basis for treatment or other patient management decisions. A negative result may occur with  improper specimen collection/handling, submission of specimen other than nasopharyngeal swab, presence of viral mutation(s) within the areas targeted by this assay, and inadequate number of viral copies(<138 copies/mL). A negative result must be combined with clinical observations, patient history,  and epidemiological information. The expected result is Negative.  Fact Sheet for Patients:  BloggerCourse.com  Fact Sheet for Healthcare Providers:  SeriousBroker.it  This test is no t yet approved or cleared by the [place]  FDA and  has been authorized for detection and/or diagnosis of SARS-CoV-2 by FDA under an Emergency Use Authorization (EUA). This EUA will remain  in effect (meaning this test can be used) for the duration of the COVID-19 declaration under Section 564(b)(1) of the Act, 21 U.S.C.section 360bbb-3(b)(1), unless the authorization is terminated  or revoked sooner.       Influenza A by PCR NEGATIVE NEGATIVE Final   Influenza B by PCR NEGATIVE NEGATIVE Final    Comment: (NOTE) The Xpert Xpress SARS-CoV-2/FLU/RSV plus assay is intended as an aid in the diagnosis of influenza from Nasopharyngeal swab specimens and should not be used as a sole basis for treatment. Nasal washings and aspirates are unacceptable for Xpert Xpress SARS-CoV-2/FLU/RSV testing.  Fact Sheet for Patients: BloggerCourse.com  Fact Sheet for Healthcare Providers: SeriousBroker.it  This test is not yet approved or cleared by the [place]  FDA and has been authorized for detection and/or diagnosis of SARS-CoV-2 by FDA under an Emergency  Use Authorization (EUA). This EUA will remain in effect (meaning this test can be used) for the duration of the COVID-19 declaration under Section 564(b)(1) of the Act, 21 U.S.C. section 360bbb-3(b)(1), unless the authorization is terminated or revoked.     Resp Syncytial Virus by PCR NEGATIVE NEGATIVE Final    Comment: (NOTE) Fact Sheet for Patients: BloggerCourse.com  Fact Sheet for Healthcare Providers: SeriousBroker.it  This test is not yet approved or cleared by the [place]  FDA and has  been authorized for detection and/or diagnosis of SARS-CoV-2 by FDA under an Emergency Use Authorization (EUA). This EUA will remain in effect (meaning this test can be used) for the duration of the COVID-19 declaration under Section 564(b)(1) of the Act, 21 U.S.C. section 360bbb-3(b)(1), unless the authorization is terminated or revoked.  Performed at [facility], [address], [place], [place] [address]   Respiratory (~20 pathogens) panel by PCR     Status: None   Collection Time: 05/22/24 12:50 PM   Specimen: Nasopharyngeal Swab; Respiratory  Result Value Ref Range Status   Adenovirus NOT DETECTED NOT DETECTED Final   Coronavirus 229E NOT DETECTED NOT DETECTED Final    Comment: (NOTE) The Coronavirus on the Respiratory Panel, DOES NOT test for the novel  Coronavirus (2019 nCoV)    Coronavirus HKU1 NOT DETECTED NOT DETECTED Final   Coronavirus NL63 NOT DETECTED NOT DETECTED Final   Coronavirus OC43 NOT DETECTED NOT DETECTED Final   Metapneumovirus NOT DETECTED NOT DETECTED Final   Rhinovirus / Enterovirus NOT DETECTED NOT DETECTED Final   Influenza A NOT DETECTED NOT DETECTED Final   Influenza B NOT DETECTED NOT DETECTED Final   Parainfluenza Virus 1 NOT DETECTED NOT DETECTED Final   Parainfluenza Virus 2 NOT DETECTED NOT DETECTED Final   Parainfluenza Virus 3 NOT DETECTED NOT DETECTED Final   Parainfluenza Virus 4 NOT DETECTED NOT DETECTED Final   Respiratory Syncytial Virus NOT DETECTED NOT DETECTED Final   Bordetella pertussis NOT DETECTED NOT DETECTED Final   Bordetella Parapertussis NOT DETECTED NOT DETECTED Final   Chlamydophila pneumoniae NOT DETECTED NOT DETECTED Final   Mycoplasma pneumoniae NOT DETECTED NOT DETECTED Final    Comment: Performed at [facility], [address] [place], [place] [address]  MRSA Next Gen by PCR, Nasal     Status: None   Collection Time: 05/23/24  7:29 PM   Specimen: Nasal Mucosa; Nasal Swab  Result Value Ref Range Status   MRSA  by PCR Next Gen NOT DETECTED NOT DETECTED Final    Comment: (NOTE) The GeneXpert MRSA Assay (FDA approved for NASAL specimens only), is one component of a comprehensive MRSA colonization surveillance program. It is not intended to diagnose MRSA infection nor to guide or monitor treatment for MRSA infections. Test performance is not FDA approved in patients less than 43 years old. Performed at [facility], [address], [place], [place] [address]          Radiology Studies: No results found.      Scheduled Meds:  budesonide  (PULMICORT ) nebulizer solution  0.5 mg Nebulization BID   Chlorhexidine  Gluconate Cloth  6 each Topical Q0600   heparin  injection (subcutaneous)  5,000 Units Subcutaneous Q8H   methylPREDNISolone  (SOLU-MEDROL ) injection  40 mg Intravenous Q12H   rosuvastatin   10 mg Oral Daily     LOS: 0 days    Time spent: 55 minutes    Isaid Salvia JONETTA Fairly, DO [facility]  If 7PM-7AM, please contact night-coverage [email or web address] 05/24/2024,  12:52 PM

## 2024-05-24 NOTE — Plan of Care (Signed)

## 2024-05-25 DIAGNOSIS — J9601 Acute respiratory failure with hypoxia: Secondary | ICD-10-CM | POA: Diagnosis not present

## 2024-05-25 LAB — CBC
HCT: 33.6 % — ABNORMAL LOW (ref 36.0–46.0)
Hemoglobin: 10.6 g/dL — ABNORMAL LOW (ref 12.0–15.0)
MCH: 31.5 pg (ref 26.0–34.0)
MCHC: 31.5 g/dL (ref 30.0–36.0)
MCV: 100 fL (ref 80.0–100.0)
Platelets: 292 K/uL (ref 150–400)
RBC: 3.36 MIL/uL — ABNORMAL LOW (ref 3.87–5.11)
RDW: 14.3 % (ref 11.5–15.5)
WBC: 12.4 K/uL — ABNORMAL HIGH (ref 4.0–10.5)
nRBC: 0 % (ref 0.0–0.2)

## 2024-05-25 LAB — BASIC METABOLIC PANEL WITH GFR
Anion gap: 8 (ref 5–15)
BUN: 19 mg/dL (ref 8–23)
CO2: 28 mmol/L (ref 22–32)
Calcium: 8.7 mg/dL — ABNORMAL LOW (ref 8.9–10.3)
Chloride: 103 mmol/L (ref 98–111)
Creatinine, Ser: 0.38 mg/dL — ABNORMAL LOW (ref 0.44–1.00)
GFR, Estimated: >60 mL/min (ref >60)
Glucose, Bld: 115 mg/dL — ABNORMAL HIGH (ref 70–99)
Potassium: 3.6 mmol/L (ref 3.5–5.1)
Sodium: 139 mmol/L (ref 135–145)

## 2024-05-25 LAB — MAGNESIUM: Magnesium: 2.2 mg/dL (ref 1.7–2.4)

## 2024-05-25 MED ORDER — BOOST / RESOURCE BREEZE PO LIQD CUSTOM
1.0000 | Freq: Two times a day (BID) | ORAL | Status: DC
Start: 2024-05-25 — End: 2024-05-27
  Administered 2024-05-25 – 2024-05-27 (×3): 1 via ORAL

## 2024-05-25 MED ORDER — DM-GUAIFENESIN ER 30-600 MG PO TB12
1.0000 | ORAL_TABLET | Freq: Two times a day (BID) | ORAL | Status: DC
  Administered 2024-05-25 – 2024-05-27 (×5): 1 via ORAL
  Filled 2024-05-25 (×5): qty 1

## 2024-05-25 MED ORDER — ADULT MULTIVITAMIN W/MINERALS CH
1.0000 | ORAL_TABLET | Freq: Every day | ORAL | Status: DC
  Administered 2024-05-25 – 2024-05-27 (×3): 1 via ORAL
  Filled 2024-05-25 (×3): qty 1

## 2024-05-25 NOTE — Discharge Instructions (Signed)

## 2024-05-25 NOTE — Progress Notes (Addendum)
 Initial Nutrition Assessment  DOCUMENTATION CODES:   Severe malnutrition in context of chronic illness, Underweight  INTERVENTION:   Chocolate milk with all meals per patient preference. Magic cup BID with meals, each supplement provides 290 kcal and 9 grams of protein. Boost Breeze po BID, each supplement provides 250 kcal and 9 grams of protein. MVI with minerals daily. Suggestions for increasing calories and protein handout from the Academy [employer] was provided in discharge instructions.   NUTRITION DIAGNOSIS:   Severe Malnutrition related to chronic illness (COPD) as evidenced by severe muscle depletion, severe fat depletion.  GOAL:   Patient will meet greater than or equal to 90% of their needs  MONITOR:   PO intake, Supplement acceptance  REASON FOR ASSESSMENT:   Rounds    ASSESSMENT:   68 yo female admitted with acute COPD exacerbation. PMH includes HTN, COPD on 2 L oxygen  as needed, osteoporosis.  Spoke with patient and her daughter at bedside. Patient typically has a small appetite, but it has really declined over the past few weeks. She likes to eat junk food at home. Daughter provides patient with dinner meals when she cooks. They live very close to each other. Daughter has been putting a scoop of protein powder in her coffee every morning. She likes chocolate milk, but not milky supplements. She used to drink chocolate Boost, but hasn't had any in a while. She agreed to try [employer] and liked it okay. She would also like to try magic cups and receive chocolate milk with her meal trays.   Patient is currently on a regular diet. Meal intakes: 75-100% of breakfast since 9/23.   Labs and medications reviewed. IV solu-medrol  has been discontinued. Oral prednisone  to be started in the AM.  Weight history reviewed. Patient with 7% weight loss over the past 4.5 months, not significant for the time frame. Patient is chronically underweight with  BMI=15.3.  Patient meets criteria for severe malnutrition, given severe depletion of muscle and subcutaneous fat mass.  NUTRITION - FOCUSED PHYSICAL EXAM:  Flowsheet Row Most Recent Value  Orbital Region Severe depletion  Upper Arm Region Severe depletion  Thoracic and Lumbar Region Severe depletion  Buccal Region Severe depletion  Temple Region Severe depletion  Clavicle Bone Region Severe depletion  Clavicle and Acromion Bone Region Severe depletion  Scapular Bone Region Severe depletion  Dorsal Hand Severe depletion  Patellar Region Severe depletion  Anterior Thigh Region Severe depletion  Posterior Calf Region Severe depletion  Edema (RD Assessment) None  Hair Reviewed  Eyes Reviewed  Mouth Reviewed  Skin Reviewed  Nails Reviewed    Diet Order:   Diet Order             Diet regular Room service appropriate? Yes; Fluid consistency: Thin  Diet effective now                   EDUCATION NEEDS:   Education needs have been addressed  Skin:  Skin Assessment: Reviewed RN Assessment  Last BM:  9/22  Height:   Ht Readings from Last 1 Encounters:  05/23/24 5' 1 (1.549 m)    Weight:   Wt Readings from Last 1 Encounters:  05/23/24 36.8 kg    Ideal Body Weight:  47.7 kg  BMI:  Body mass index is 15.33 kg/m.  Estimated Nutritional Needs:   Kcal:  1300-1500  Protein:  55-65 gm  Fluid:  1.5 L   Suzen HUNT RD, LDN, CNSC Contact via  secure chat. If unavailable, use group chat RD Inpatient.

## 2024-05-25 NOTE — Plan of Care (Signed)

## 2024-05-25 NOTE — Progress Notes (Signed)
 PROGRESS NOTE    Natasha [name]  FMW:[id number] DOB: 02-10-1956 DOA: 05/22/2024 PCP: Shona Arthea DASEN, DO   Brief Narrative:    68 year old female with a history of hypertension, COPD, chronic respiratory failure on 2 L as needed, hyperlipidemia, anxiety presenting with 1 month history of coughing, shortness of breath, chest congestion.  She was admitted for acute on chronic hypoxemic respiratory failure secondary to acute COPD exacerbation and was started on IV steroids as well as breathing treatments.  She developed palpitations on 9/23 and was thought to be in atrial tachycardia and was started on IV Cardizem  drip, however it appears that she has remained in sinus tachycardia likely as a result of her ongoing treatment of her respiratory issues.  She continues to remain in sinus tachycardia and breathing treatments and steroids are being weaned.  Assessment & Plan:   Principal Problem:   Acute respiratory failure with hypoxia (HCC) Active Problems:   COPD exacerbation (HCC)   Acute on chronic respiratory failure with hypoxia (HCC)   Mixed hyperlipidemia   Multifocal atrial tachycardia  Assessment and Plan:   Acute on chronic respiratory failure with hypoxia-back to baseline -Normally on 2 L as needed at home currently on 2 L -wean oxygen  for saturation >92% - Secondary COPD exacerbation - COVID/RSV/Flu--neg - viral respiratory panel--neg   COPD exacerbation-resolved -Continue brovana  -Continue pulmicort  - Discontinue further IV Solu-Medrol  and will start oral prednisone  in a.m. - No further breathing treatments required -clinically improving   Anxiety -continue xanax  -PDMP reviewed -xanax  1 mg, #90, refilled 04/28/24   Mixed Hyperlipidemia -continue crestor    Sinus tachycardia - Continue to monitor with further weaning of steroids and no further use of breathing treatments - TSH low, but free T4 appropriate  Deconditioning -Would benefit from PT/OT evaluation in  a.m. once heart rates are better controlled.    DVT prophylaxis: Heparin  Code Status: Full Family Communication: Daughter at bedside 9/25 Disposition Plan:  Status is: Inpatient Remains inpatient appropriate because: Continues on IV medications   Consultants:  None  Procedures:  None  Antimicrobials:  None   Subjective: Patient seen and evaluated today with no complaints of any shortness of breath, cough, congestion, or chest tightness.  She denies any palpitations although her heart rates remain elevated in the 100-120 range.  She did receive a breathing treatment this morning, but overnight her heart rates were much improved.  Objective: Vitals:   05/25/24 0738 05/25/24 0800 05/25/24 0807 05/25/24 0809  BP:  (!) 146/64    Pulse:  94    Resp:  17    Temp: 98.6 F (37 C)     TempSrc: Oral     SpO2:  97% 97% 100%  Weight:      Height:        Intake/Output Summary (Last 24 hours) at 05/25/2024 1109 Last data filed at 05/25/2024 0738 Gross per 24 hour  Intake 266.05 ml  Output --  Net 266.05 ml   Filed Weights   05/22/24 0823 05/23/24 2000  Weight: 35.8 kg 36.8 kg    Examination:  General exam: Appears calm and comfortable  Respiratory system: Clear to auscultation. Respiratory effort normal.  Nasal cannula oxygen  2 L Cardiovascular system: S1 & S2 heard, RRR.  Tachycardic Gastrointestinal system: Abdomen is soft Central nervous system: Alert and awake Extremities: No edema Skin: No significant lesions noted Psychiatry: Flat affect.    Data Reviewed: I have personally reviewed following labs and imaging studies  CBC: Recent Labs  Lab 05/22/24 0950 05/24/24 0446 05/25/24 0258  WBC 3.8* 13.5* 12.4*  NEUTROABS 2.8  --   --   HGB 12.0 10.6* 10.6*  HCT 37.9 33.1* 33.6*  MCV 100.5* 100.0 100.0  PLT 216 270 292   Basic Metabolic Panel: Recent Labs  Lab 05/22/24 0826 05/23/24 0436 05/24/24 0446 05/25/24 0258  NA 137 142 143 139  K 4.4 3.9 4.1  3.6  CL 102 104 103 103  CO2 23 28 28 28   GLUCOSE 85 140* 125* 115*  BUN 13 16 21 19   CREATININE 0.45 0.38* 0.42* 0.38*  CALCIUM  8.7* 9.3 9.2 8.7*  MG  --  2.0 1.9 2.2   GFR: Estimated Creatinine Clearance: 38.6 mL/min (A) (by C-G formula based on SCr of 0.38 mg/dL (L)). Liver Function Tests: No results for input(s): AST, ALT, ALKPHOS, BILITOT, PROT, ALBUMIN in the last 168 hours. No results for input(s): LIPASE, AMYLASE in the last 168 hours. No results for input(s): AMMONIA in the last 168 hours. Coagulation Profile: No results for input(s): INR, PROTIME in the last 168 hours. Cardiac Enzymes: No results for input(s): CKTOTAL, CKMB, CKMBINDEX, TROPONINI in the last 168 hours. BNP (last 3 results) No results for input(s): PROBNP in the last 8760 hours. HbA1C: No results for input(s): HGBA1C in the last 72 hours. CBG: No results for input(s): GLUCAP in the last 168 hours. Lipid Profile: No results for input(s): CHOL, HDL, LDLCALC, TRIG, CHOLHDL, LDLDIRECT in the last 72 hours. Thyroid  Function Tests: Recent Labs    05/24/24 0446  TSH 0.172*  FREET4 0.96   Anemia Panel: No results for input(s): VITAMINB12, FOLATE, FERRITIN, TIBC, IRON, RETICCTPCT in the last 72 hours. Sepsis Labs: No results for input(s): PROCALCITON, LATICACIDVEN in the last 168 hours.  Recent Results (from the past 240 hours)  Resp panel by RT-PCR (RSV, Flu A&B, Covid) Anterior Nasal Swab     Status: None   Collection Time: 05/22/24  8:29 AM   Specimen: Anterior Nasal Swab  Result Value Ref Range Status   SARS Coronavirus 2 by RT PCR NEGATIVE NEGATIVE Final    Comment: (NOTE) SARS-CoV-2 target nucleic acids are NOT DETECTED.  The SARS-CoV-2 RNA is generally detectable in upper respiratory specimens during the acute phase of infection. The lowest concentration of SARS-CoV-2 viral copies this assay can detect is 138 copies/mL. A negative  result does not preclude SARS-Cov-2 infection and should not be used as the sole basis for treatment or other patient management decisions. A negative result may occur with  improper specimen collection/handling, submission of specimen other than nasopharyngeal swab, presence of viral mutation(s) within the areas targeted by this assay, and inadequate number of viral copies(<138 copies/mL). A negative result must be combined with clinical observations, patient history, and epidemiological information. The expected result is Negative.  Fact Sheet for Patients:  BloggerCourse.com  Fact Sheet for Healthcare Providers:  SeriousBroker.it  This test is no t yet approved or cleared by the [place]  FDA and  has been authorized for detection and/or diagnosis of SARS-CoV-2 by FDA under an Emergency Use Authorization (EUA). This EUA will remain  in effect (meaning this test can be used) for the duration of the COVID-19 declaration under Section 564(b)(1) of the Act, 21 U.S.C.section 360bbb-3(b)(1), unless the authorization is terminated  or revoked sooner.       Influenza A by PCR NEGATIVE NEGATIVE Final   Influenza B by PCR NEGATIVE NEGATIVE Final    Comment: (NOTE) The Xpert Xpress SARS-CoV-2/FLU/RSV plus  assay is intended as an aid in the diagnosis of influenza from Nasopharyngeal swab specimens and should not be used as a sole basis for treatment. Nasal washings and aspirates are unacceptable for Xpert Xpress SARS-CoV-2/FLU/RSV testing.  Fact Sheet for Patients: BloggerCourse.com  Fact Sheet for Healthcare Providers: SeriousBroker.it  This test is not yet approved or cleared by the [place]  FDA and has been authorized for detection and/or diagnosis of SARS-CoV-2 by FDA under an Emergency Use Authorization (EUA). This EUA will remain in effect (meaning this test can be used)  for the duration of the COVID-19 declaration under Section 564(b)(1) of the Act, 21 U.S.C. section 360bbb-3(b)(1), unless the authorization is terminated or revoked.     Resp Syncytial Virus by PCR NEGATIVE NEGATIVE Final    Comment: (NOTE) Fact Sheet for Patients: BloggerCourse.com  Fact Sheet for Healthcare Providers: SeriousBroker.it  This test is not yet approved or cleared by the [place]  FDA and has been authorized for detection and/or diagnosis of SARS-CoV-2 by FDA under an Emergency Use Authorization (EUA). This EUA will remain in effect (meaning this test can be used) for the duration of the COVID-19 declaration under Section 564(b)(1) of the Act, 21 U.S.C. section 360bbb-3(b)(1), unless the authorization is terminated or revoked.  Performed at [facility], [address], [place], [place] [address]   Respiratory (~20 pathogens) panel by PCR     Status: None   Collection Time: 05/22/24 12:50 PM   Specimen: Nasopharyngeal Swab; Respiratory  Result Value Ref Range Status   Adenovirus NOT DETECTED NOT DETECTED Final   Coronavirus 229E NOT DETECTED NOT DETECTED Final    Comment: (NOTE) The Coronavirus on the Respiratory Panel, DOES NOT test for the novel  Coronavirus (2019 nCoV)    Coronavirus HKU1 NOT DETECTED NOT DETECTED Final   Coronavirus NL63 NOT DETECTED NOT DETECTED Final   Coronavirus OC43 NOT DETECTED NOT DETECTED Final   Metapneumovirus NOT DETECTED NOT DETECTED Final   Rhinovirus / Enterovirus NOT DETECTED NOT DETECTED Final   Influenza A NOT DETECTED NOT DETECTED Final   Influenza B NOT DETECTED NOT DETECTED Final   Parainfluenza Virus 1 NOT DETECTED NOT DETECTED Final   Parainfluenza Virus 2 NOT DETECTED NOT DETECTED Final   Parainfluenza Virus 3 NOT DETECTED NOT DETECTED Final   Parainfluenza Virus 4 NOT DETECTED NOT DETECTED Final   Respiratory Syncytial Virus NOT DETECTED NOT DETECTED Final    Bordetella pertussis NOT DETECTED NOT DETECTED Final   Bordetella Parapertussis NOT DETECTED NOT DETECTED Final   Chlamydophila pneumoniae NOT DETECTED NOT DETECTED Final   Mycoplasma pneumoniae NOT DETECTED NOT DETECTED Final    Comment: Performed at [facility], [address], [place], [place] [address]  MRSA Next Gen by PCR, Nasal     Status: None   Collection Time: 05/23/24  7:29 PM   Specimen: Nasal Mucosa; Nasal Swab  Result Value Ref Range Status   MRSA by PCR Next Gen NOT DETECTED NOT DETECTED Final    Comment: (NOTE) The GeneXpert MRSA Assay (FDA approved for NASAL specimens only), is one component of a comprehensive MRSA colonization surveillance program. It is not intended to diagnose MRSA infection nor to guide or monitor treatment for MRSA infections. Test performance is not FDA approved in patients less than 53 years old. Performed at [facility], [address], [place], [place] [address]          Radiology Studies: ECHOCARDIOGRAM COMPLETE Result Date: 05/24/2024    ECHOCARDIOGRAM REPORT  Patient Name:   Natasha [name] [facility] Date of Exam: 05/24/2024 Medical Rec #:  [id number]      Height:       61.0 in Accession #:    [id number]     Weight:       81.1 lb Date of Birth:  03-04-1956      BSA:          1.287 m Patient Age:    68 years       BP:           143/66 mmHg Patient Gender: F              HR:           100 bpm. Exam Location:  [facility] Procedure: 2D Echo, Cardiac Doppler, Color Doppler and Intracardiac            Opacification Agent (Both Spectral and Color Flow Doppler were            utilized during procedure). Indications:    Atrial tachycardia  History:        Patient has prior history of Echocardiogram examinations, most                 recent 12/19/2013. COPD; Risk Factors:Hypertension and                 Dyslipidemia.  Sonographer:    Aida Pizza RCS Referring Phys: [phone] DAVID TAT  Sonographer Comments: Technically difficult study due to poor echo windows.  IMPRESSIONS  1. Left ventricular ejection fraction, by estimation, is 65 to 70%. The left ventricle has normal function. The left ventricle has no regional wall motion abnormalities. Left ventricular diastolic parameters are indeterminate.  2. Right ventricular systolic function is normal. The right ventricular size is normal. Tricuspid regurgitation signal is inadequate for assessing PA pressure.  3. The mitral valve is degenerative. Trivial mitral valve regurgitation.  4. The aortic valve is tricuspid. There is mild calcification of the aortic valve. Aortic valve regurgitation is not visualized.  5. The inferior vena cava is normal in size with greater than 50% respiratory variability, suggesting right atrial pressure of 3 mmHg. Comparison(s): Prior images unable to be directly viewed. FINDINGS  Left Ventricle: Left ventricular ejection fraction, by estimation, is 65 to 70%. The left ventricle has normal function. The left ventricle has no regional wall motion abnormalities. Definity  contrast agent was given IV to delineate the left ventricular  endocardial borders. The left ventricular internal cavity size was normal in size. There is no left ventricular hypertrophy. Left ventricular diastolic parameters are indeterminate. Right Ventricle: The right ventricular size is normal. No increase in right ventricular wall thickness. Right ventricular systolic function is normal. Tricuspid regurgitation signal is inadequate for assessing PA pressure. Left Atrium: Left atrial size was normal in size. Right Atrium: Right atrial size was normal in size. Pericardium: There is no evidence of pericardial effusion. Mitral Valve: The mitral valve is degenerative in appearance. There is mild calcification of the mitral valve leaflet(s). Trivial mitral valve regurgitation. Tricuspid Valve: The tricuspid valve is grossly normal. Tricuspid valve regurgitation is trivial. Aortic Valve: The aortic valve is tricuspid. There is mild  calcification of the aortic valve. There is mild aortic valve annular calcification. Aortic valve regurgitation is not visualized. Pulmonic Valve: The pulmonic valve was grossly normal. Pulmonic valve regurgitation is trivial. Aorta: The aortic root is normal in size and structure. Venous: The inferior vena cava is normal in size with greater  than 50% respiratory variability, suggesting right atrial pressure of 3 mmHg. IAS/Shunts: No atrial level shunt detected by color flow Doppler. Additional Comments: 3D was performed not requiring image post processing on an independent workstation and was indeterminate.  LEFT VENTRICLE PLAX 2D LVIDd:         3.30 cm   Diastology LVIDs:         2.20 cm   LV e' medial:    10.60 cm/s LV PW:         0.90 cm   LV E/e' medial:  10.3 LV IVS:        0.80 cm   LV e' lateral:   10.80 cm/s LVOT diam:     1.40 cm   LV E/e' lateral: 10.1 LV SV:         34 LV SV Index:   26 LVOT Area:     1.54 cm  RIGHT VENTRICLE RV S prime:     13.70 cm/s TAPSE (M-mode): 2.1 cm LEFT ATRIUM           Index        RIGHT ATRIUM          Index LA diam:      2.10 cm 1.63 cm/m   RA Area:     7.85 cm LA Vol (A2C): 15.4 ml 11.93 ml/m  RA Volume:   13.10 ml 10.18 ml/m LA Vol (A4C): 22.2 ml 17.25 ml/m  AORTIC VALVE LVOT Vmax:   117.00 cm/s LVOT Vmean:  65.300 cm/s LVOT VTI:    0.218 m  AORTA Ao Root diam: 2.50 cm MITRAL VALVE MV Area (PHT): 4.21 cm     SHUNTS MV Decel Time: 180 msec     Systemic VTI:  0.22 m MV E velocity: 109.00 cm/s  Systemic Diam: 1.40 cm MV A velocity: 77.60 cm/s MV E/A ratio:  1.40 Jayson Sierras MD Electronically signed by Jayson Sierras MD Signature Date/Time: 05/24/2024/4:36:06 PM    Final         Scheduled Meds:  budesonide  (PULMICORT ) nebulizer solution  0.5 mg Nebulization BID   Chlorhexidine  Gluconate Cloth  6 each Topical Q0600   dextromethorphan -guaiFENesin   1 tablet Oral BID   heparin  injection (subcutaneous)  5,000 Units Subcutaneous Q8H   rosuvastatin   10 mg Oral  Daily   sertraline   25 mg Oral Daily     LOS: 1 day    Time spent: 55 minutes    Yvette Loveless JONETTA Fairly, DO [facility]  If 7PM-7AM, please contact night-coverage [email or web address] 05/25/2024, 11:09 AM

## 2024-05-26 DIAGNOSIS — J9601 Acute respiratory failure with hypoxia: Secondary | ICD-10-CM | POA: Diagnosis not present

## 2024-05-26 MED ORDER — PREDNISONE 20 MG PO TABS
40.0000 mg | ORAL_TABLET | Freq: Every day | ORAL | Status: DC
  Administered 2024-05-26: 40 mg via ORAL
  Filled 2024-05-26: qty 2

## 2024-05-26 NOTE — Plan of Care (Signed)
  Problem: Acute Rehab PT Goals(only PT should resolve) Goal: Pt Will Go Supine/Side To Sit Outcome: Progressing Flowsheets (Taken 05/26/2024 1353) Pt will go Supine/Side to Sit: Independently Goal: Patient Will Transfer Sit To/From Stand Outcome: Progressing Flowsheets (Taken 05/26/2024 1353) Patient will transfer sit to/from stand: Independently Goal: Pt Will Transfer Bed To Chair/Chair To Bed Outcome: Progressing Flowsheets (Taken 05/26/2024 1353) Pt will Transfer Bed to Chair/Chair to Bed: Independently Goal: Pt Will Ambulate Outcome: Progressing Flowsheets (Taken 05/26/2024 1353) Pt will Ambulate:  100 feet  with modified independence  Independently  with least restrictive assistive device   1:54 PM, 05/26/24 Lynwood Music, MPT Physical Therapist with [facility] [phone] office [phone] mobile phone

## 2024-05-26 NOTE — Care Management Important Message (Signed)
 Important Message  Patient Details  Name: Natasha MCAULEY MRN: [id number] Date of Birth: March 04, 1956   Important Message Given:  Yes - Medicare IM     Latori Beggs L Cohan Stipes 05/26/2024, 4:11 PM

## 2024-05-26 NOTE — Progress Notes (Signed)
 PROGRESS NOTE    Natasha [name]  FMW:[id number] DOB: 1955/11/23 DOA: 05/22/2024 PCP: Shona Arthea DASEN, DO   Brief Narrative:    68 year old female with a history of hypertension, COPD, chronic respiratory failure on 2 L as needed, hyperlipidemia, anxiety presenting with 1 month history of coughing, shortness of breath, chest congestion.  She was admitted for acute on chronic hypoxemic respiratory failure secondary to acute COPD exacerbation and was started on IV steroids as well as breathing treatments.  She developed palpitations on 9/23 and was thought to be in atrial tachycardia and was started on IV Cardizem  drip, however it appears that she has remained in sinus tachycardia likely as a result of her ongoing treatment of her respiratory issues.  She continues to remain in sinus tachycardia and breathing treatments and steroids are being weaned.  Assessment & Plan:   Principal Problem:   Acute respiratory failure with hypoxia (HCC) Active Problems:   COPD exacerbation (HCC)   Acute on chronic respiratory failure with hypoxia (HCC)   Mixed hyperlipidemia   Multifocal atrial tachycardia  Assessment and Plan:   Acute on chronic respiratory failure with hypoxia-back to baseline -Normally on 2 L as needed at home currently on 2 L -wean oxygen  for saturation >92% - Secondary COPD exacerbation - COVID/RSV/Flu--neg - viral respiratory panel--neg   COPD exacerbation-resolved -Continue brovana  -Continue pulmicort  - Discontinue further IV Solu-Medrol  and will start oral prednisone  in a.m. - No further breathing treatments required -clinically improving   Anxiety -continue xanax  -PDMP reviewed -xanax  1 mg, #90, refilled 04/28/24   Mixed Hyperlipidemia -continue crestor    Sinus tachycardia-improving - Continue to monitor with further weaning of steroids and no further use of breathing treatments - TSH low, but free T4 appropriate - Appears to have significant tachycardia with  ambulation likely related to deconditioning, may go to telemetry floor if heart rates are improved  Deconditioning - PT with no recommendations    DVT prophylaxis: Heparin  Code Status: Full Family Communication: Daughter at bedside 9/26 Disposition Plan:  Status is: Inpatient Remains inpatient appropriate because: Continues on IV medications   Consultants:  None  Procedures:  None  Antimicrobials:  None   Subjective: Patient seen and evaluated today with no complaints of any shortness of breath, cough, congestion, or chest tightness.  Heart rates have improved and she remains on baseline 2 L nasal cannula.  She does get quite tachycardic and fatigued with ambulation.  Objective: Vitals:   05/26/24 0600 05/26/24 0630 05/26/24 0730 05/26/24 0900  BP: 108/62 (!) 137/55    Pulse: 92 88    Resp: 14 15    Temp:   97.7 F (36.5 C)   TempSrc:   Oral   SpO2: 100% 98%  97%  Weight:      Height:        Intake/Output Summary (Last 24 hours) at 05/26/2024 1111 Last data filed at 05/26/2024 0913 Gross per 24 hour  Intake 480 ml  Output 300 ml  Net 180 ml   Filed Weights   05/22/24 0823 05/23/24 2000  Weight: 35.8 kg 36.8 kg    Examination:  General exam: Appears calm and comfortable  Respiratory system: Clear to auscultation. Respiratory effort normal.  Nasal cannula oxygen  2 L Cardiovascular system: S1 & S2 heard, RRR. Gastrointestinal system: Abdomen is soft Central nervous system: Alert and awake Extremities: No edema Skin: No significant lesions noted Psychiatry: Flat affect.    Data Reviewed: I have personally reviewed following labs and imaging studies  CBC: Recent Labs  Lab 05/22/24 0950 05/24/24 0446 05/25/24 0258  WBC 3.8* 13.5* 12.4*  NEUTROABS 2.8  --   --   HGB 12.0 10.6* 10.6*  HCT 37.9 33.1* 33.6*  MCV 100.5* 100.0 100.0  PLT 216 270 292   Basic Metabolic Panel: Recent Labs  Lab 05/22/24 0826 05/23/24 0436 05/24/24 0446  05/25/24 0258  NA 137 142 143 139  K 4.4 3.9 4.1 3.6  CL 102 104 103 103  CO2 23 28 28 28   GLUCOSE 85 140* 125* 115*  BUN 13 16 21 19   CREATININE 0.45 0.38* 0.42* 0.38*  CALCIUM  8.7* 9.3 9.2 8.7*  MG  --  2.0 1.9 2.2   GFR: Estimated Creatinine Clearance: 38.6 mL/min (A) (by C-G formula based on SCr of 0.38 mg/dL (L)). Liver Function Tests: No results for input(s): AST, ALT, ALKPHOS, BILITOT, PROT, ALBUMIN in the last 168 hours. No results for input(s): LIPASE, AMYLASE in the last 168 hours. No results for input(s): AMMONIA in the last 168 hours. Coagulation Profile: No results for input(s): INR, PROTIME in the last 168 hours. Cardiac Enzymes: No results for input(s): CKTOTAL, CKMB, CKMBINDEX, TROPONINI in the last 168 hours. BNP (last 3 results) No results for input(s): PROBNP in the last 8760 hours. HbA1C: No results for input(s): HGBA1C in the last 72 hours. CBG: No results for input(s): GLUCAP in the last 168 hours. Lipid Profile: No results for input(s): CHOL, HDL, LDLCALC, TRIG, CHOLHDL, LDLDIRECT in the last 72 hours. Thyroid  Function Tests: Recent Labs    05/24/24 0446  TSH 0.172*  FREET4 0.96   Anemia Panel: No results for input(s): VITAMINB12, FOLATE, FERRITIN, TIBC, IRON, RETICCTPCT in the last 72 hours. Sepsis Labs: No results for input(s): PROCALCITON, LATICACIDVEN in the last 168 hours.  Recent Results (from the past 240 hours)  Resp panel by RT-PCR (RSV, Flu A&B, Covid) Anterior Nasal Swab     Status: None   Collection Time: 05/22/24  8:29 AM   Specimen: Anterior Nasal Swab  Result Value Ref Range Status   SARS Coronavirus 2 by RT PCR NEGATIVE NEGATIVE Final    Comment: (NOTE) SARS-CoV-2 target nucleic acids are NOT DETECTED.  The SARS-CoV-2 RNA is generally detectable in upper respiratory specimens during the acute phase of infection. The lowest concentration of SARS-CoV-2 viral copies  this assay can detect is 138 copies/mL. A negative result does not preclude SARS-Cov-2 infection and should not be used as the sole basis for treatment or other patient management decisions. A negative result may occur with  improper specimen collection/handling, submission of specimen other than nasopharyngeal swab, presence of viral mutation(s) within the areas targeted by this assay, and inadequate number of viral copies(<138 copies/mL). A negative result must be combined with clinical observations, patient history, and epidemiological information. The expected result is Negative.  Fact Sheet for Patients:  BloggerCourse.com  Fact Sheet for Healthcare Providers:  SeriousBroker.it  This test is no t yet approved or cleared by the [place]  FDA and  has been authorized for detection and/or diagnosis of SARS-CoV-2 by FDA under an Emergency Use Authorization (EUA). This EUA will remain  in effect (meaning this test can be used) for the duration of the COVID-19 declaration under Section 564(b)(1) of the Act, 21 U.S.C.section 360bbb-3(b)(1), unless the authorization is terminated  or revoked sooner.       Influenza A by PCR NEGATIVE NEGATIVE Final   Influenza B by PCR NEGATIVE NEGATIVE Final    Comment: (NOTE) The  Xpert Xpress SARS-CoV-2/FLU/RSV plus assay is intended as an aid in the diagnosis of influenza from Nasopharyngeal swab specimens and should not be used as a sole basis for treatment. Nasal washings and aspirates are unacceptable for Xpert Xpress SARS-CoV-2/FLU/RSV testing.  Fact Sheet for Patients: BloggerCourse.com  Fact Sheet for Healthcare Providers: SeriousBroker.it  This test is not yet approved or cleared by the [place]  FDA and has been authorized for detection and/or diagnosis of SARS-CoV-2 by FDA under an Emergency Use Authorization (EUA). This EUA  will remain in effect (meaning this test can be used) for the duration of the COVID-19 declaration under Section 564(b)(1) of the Act, 21 U.S.C. section 360bbb-3(b)(1), unless the authorization is terminated or revoked.     Resp Syncytial Virus by PCR NEGATIVE NEGATIVE Final    Comment: (NOTE) Fact Sheet for Patients: BloggerCourse.com  Fact Sheet for Healthcare Providers: SeriousBroker.it  This test is not yet approved or cleared by the [place]  FDA and has been authorized for detection and/or diagnosis of SARS-CoV-2 by FDA under an Emergency Use Authorization (EUA). This EUA will remain in effect (meaning this test can be used) for the duration of the COVID-19 declaration under Section 564(b)(1) of the Act, 21 U.S.C. section 360bbb-3(b)(1), unless the authorization is terminated or revoked.  Performed at [facility], [address], [place], [place] [address]   Respiratory (~20 pathogens) panel by PCR     Status: None   Collection Time: 05/22/24 12:50 PM   Specimen: Nasopharyngeal Swab; Respiratory  Result Value Ref Range Status   Adenovirus NOT DETECTED NOT DETECTED Final   Coronavirus 229E NOT DETECTED NOT DETECTED Final    Comment: (NOTE) The Coronavirus on the Respiratory Panel, DOES NOT test for the novel  Coronavirus (2019 nCoV)    Coronavirus HKU1 NOT DETECTED NOT DETECTED Final   Coronavirus NL63 NOT DETECTED NOT DETECTED Final   Coronavirus OC43 NOT DETECTED NOT DETECTED Final   Metapneumovirus NOT DETECTED NOT DETECTED Final   Rhinovirus / Enterovirus NOT DETECTED NOT DETECTED Final   Influenza A NOT DETECTED NOT DETECTED Final   Influenza B NOT DETECTED NOT DETECTED Final   Parainfluenza Virus 1 NOT DETECTED NOT DETECTED Final   Parainfluenza Virus 2 NOT DETECTED NOT DETECTED Final   Parainfluenza Virus 3 NOT DETECTED NOT DETECTED Final   Parainfluenza Virus 4 NOT DETECTED NOT DETECTED Final   Respiratory  Syncytial Virus NOT DETECTED NOT DETECTED Final   Bordetella pertussis NOT DETECTED NOT DETECTED Final   Bordetella Parapertussis NOT DETECTED NOT DETECTED Final   Chlamydophila pneumoniae NOT DETECTED NOT DETECTED Final   Mycoplasma pneumoniae NOT DETECTED NOT DETECTED Final    Comment: Performed at [facility], [address], [place], [place] [address]  MRSA Next Gen by PCR, Nasal     Status: None   Collection Time: 05/23/24  7:29 PM   Specimen: Nasal Mucosa; Nasal Swab  Result Value Ref Range Status   MRSA by PCR Next Gen NOT DETECTED NOT DETECTED Final    Comment: (NOTE) The GeneXpert MRSA Assay (FDA approved for NASAL specimens only), is one component of a comprehensive MRSA colonization surveillance program. It is not intended to diagnose MRSA infection nor to guide or monitor treatment for MRSA infections. Test performance is not FDA approved in patients less than 70 years old. Performed at [facility], [address], [place], [place] [address]          Radiology Studies: ECHOCARDIOGRAM COMPLETE Result Date: 05/24/2024  ECHOCARDIOGRAM REPORT   Patient Name:   Natasha [name] [facility] Date of Exam: 05/24/2024 Medical Rec #:  [id number]      Height:       61.0 in Accession #:    [id number]     Weight:       81.1 lb Date of Birth:  [birth year]      BSA:          1.287 m Patient Age:    68 years       BP:           143/66 mmHg Patient Gender: F              HR:           100 bpm. Exam Location:  [facility] Procedure: 2D Echo, Cardiac Doppler, Color Doppler and Intracardiac            Opacification Agent (Both Spectral and Color Flow Doppler were            utilized during procedure). Indications:    Atrial tachycardia  History:        Patient has prior history of Echocardiogram examinations, most                 recent 12/19/2013. COPD; Risk Factors:Hypertension and                 Dyslipidemia.  Sonographer:    Aida Pizza RCS Referring Phys: [phone] DAVID TAT  Sonographer Comments:  Technically difficult study due to poor echo windows. IMPRESSIONS  1. Left ventricular ejection fraction, by estimation, is 65 to 70%. The left ventricle has normal function. The left ventricle has no regional wall motion abnormalities. Left ventricular diastolic parameters are indeterminate.  2. Right ventricular systolic function is normal. The right ventricular size is normal. Tricuspid regurgitation signal is inadequate for assessing PA pressure.  3. The mitral valve is degenerative. Trivial mitral valve regurgitation.  4. The aortic valve is tricuspid. There is mild calcification of the aortic valve. Aortic valve regurgitation is not visualized.  5. The inferior vena cava is normal in size with greater than 50% respiratory variability, suggesting right atrial pressure of 3 mmHg. Comparison(s): Prior images unable to be directly viewed. FINDINGS  Left Ventricle: Left ventricular ejection fraction, by estimation, is 65 to 70%. The left ventricle has normal function. The left ventricle has no regional wall motion abnormalities. Definity  contrast agent was given IV to delineate the left ventricular  endocardial borders. The left ventricular internal cavity size was normal in size. There is no left ventricular hypertrophy. Left ventricular diastolic parameters are indeterminate. Right Ventricle: The right ventricular size is normal. No increase in right ventricular wall thickness. Right ventricular systolic function is normal. Tricuspid regurgitation signal is inadequate for assessing PA pressure. Left Atrium: Left atrial size was normal in size. Right Atrium: Right atrial size was normal in size. Pericardium: There is no evidence of pericardial effusion. Mitral Valve: The mitral valve is degenerative in appearance. There is mild calcification of the mitral valve leaflet(s). Trivial mitral valve regurgitation. Tricuspid Valve: The tricuspid valve is grossly normal. Tricuspid valve regurgitation is trivial. Aortic  Valve: The aortic valve is tricuspid. There is mild calcification of the aortic valve. There is mild aortic valve annular calcification. Aortic valve regurgitation is not visualized. Pulmonic Valve: The pulmonic valve was grossly normal. Pulmonic valve regurgitation is trivial. Aorta: The aortic root is normal in size and structure. Venous: The inferior vena cava is normal  in size with greater than 50% respiratory variability, suggesting right atrial pressure of 3 mmHg. IAS/Shunts: No atrial level shunt detected by color flow Doppler. Additional Comments: 3D was performed not requiring image post processing on an independent workstation and was indeterminate.  LEFT VENTRICLE PLAX 2D LVIDd:         3.30 cm   Diastology LVIDs:         2.20 cm   LV e' medial:    10.60 cm/s LV PW:         0.90 cm   LV E/e' medial:  10.3 LV IVS:        0.80 cm   LV e' lateral:   10.80 cm/s LVOT diam:     1.40 cm   LV E/e' lateral: 10.1 LV SV:         34 LV SV Index:   26 LVOT Area:     1.54 cm  RIGHT VENTRICLE RV S prime:     13.70 cm/s TAPSE (M-mode): 2.1 cm LEFT ATRIUM           Index        RIGHT ATRIUM          Index LA diam:      2.10 cm 1.63 cm/m   RA Area:     7.85 cm LA Vol (A2C): 15.4 ml 11.93 ml/m  RA Volume:   13.10 ml 10.18 ml/m LA Vol (A4C): 22.2 ml 17.25 ml/m  AORTIC VALVE LVOT Vmax:   117.00 cm/s LVOT Vmean:  65.300 cm/s LVOT VTI:    0.218 m  AORTA Ao Root diam: 2.50 cm MITRAL VALVE MV Area (PHT): 4.21 cm     SHUNTS MV Decel Time: 180 msec     Systemic VTI:  0.22 m MV E velocity: 109.00 cm/s  Systemic Diam: 1.40 cm MV A velocity: 77.60 cm/s MV E/A ratio:  1.40 Jayson Sierras MD Electronically signed by Jayson Sierras MD Signature Date/Time: 05/24/2024/4:36:06 PM    Final         Scheduled Meds:  budesonide  (PULMICORT ) nebulizer solution  0.5 mg Nebulization BID   Chlorhexidine  Gluconate Cloth  6 each Topical Q0600   dextromethorphan -guaiFENesin   1 tablet Oral BID   feeding supplement  1 Container Oral  BID BM   heparin  injection (subcutaneous)  5,000 Units Subcutaneous Q8H   multivitamin with minerals  1 tablet Oral Daily   predniSONE   40 mg Oral Q breakfast   rosuvastatin   10 mg Oral Daily   sertraline   25 mg Oral Daily     LOS: 2 days    Time spent: 55 minutes    Daesha Insco JONETTA Fairly, DO [facility]  If 7PM-7AM, please contact night-coverage [email or web address] 05/26/2024, 11:11 AM

## 2024-05-26 NOTE — Evaluation (Signed)
 Physical Therapy Evaluation Patient Details Name: RENNEE [name] MRN: [id number] DOB: 1956-06-28 Today's Date: 05/26/2024  History of Present Illness  Natasha [name] is a 68 year old female with a history of hypertension, COPD, chronic respiratory failure on 2 L as needed, hyperlipidemia, anxiety presenting with 1 month history of coughing, shortness of breath, chest congestion.  The patient states that she has seen her PCP and initially was placed on Augmentin and a steroid taper.  She had some temporary relief.  Her shortness of breath came back.  She was placed on doxycycline and another steroid taper.  Although she had some relief, her overall shortness of breath continue to worsen with dyspnea on exertion and coughing.  She quit smoking 10 years ago after about a 20-pack-year history.  She denies any nausea, vomiting or diarrhea, abdominal pain, dysuria, hematuria.  She denies any chest pain.  She has a nonproductive cough.  She denies any hemoptysis.  She denies any worsening lower extremity edema orthopnea.  In the ED, the patient was afebrile and hemodynamically stable with oxygen  saturation 98% on 2 L.  WBC 3.8, hemoglobin 12.0, platelets 216.  Sodium 137, potassium 4.4, bicarbonate 23, serum creatinine 0.45.  EKG showed sinus tachycardia without concerning ST/T wave changes.  D-dimer 0.41.  BNP 31.  COVID-19 PCR is negative.  Chest x-ray was negative for infiltrates.  Showed hyperinflation.  The patient was given DuoNebs and Solu-Medrol .  She was admitted for further evaluation and treatment of her respiratory failure.   Clinical Impression  Patient demonstrates good return for bed mobility, transferring to chair and tolerated ambulating into hallway while on room air with SpO2 at 93%, no loss of balance, but limited mostly due to fatigue and HR increasing above 140 bpm - RN aware. Patient tolerated sitting up in chair after therapy. Patient will benefit from continued skilled physical therapy in  hospital and recommended venue below to increase strength, balance, endurance for safe ADLs and gait.          If plan is discharge home, recommend the following: Help with stairs or ramp for entrance;A little help with walking and/or transfers;A little help with bathing/dressing/bathroom;Assistance with cooking/housework;Assist for transportation   Can travel by private vehicle        Equipment Recommendations None recommended by PT  Recommendations for Other Services       Functional Status Assessment Patient has had a recent decline in their functional status and demonstrates the ability to make significant improvements in function in a reasonable and predictable amount of time.     Precautions / Restrictions Precautions Precautions: Fall Recall of Precautions/Restrictions: Intact Restrictions Weight Bearing Restrictions Per Provider Order: No      Mobility  Bed Mobility Overal bed mobility: Independent                  Transfers Overall transfer level: Independent                 General transfer comment: good return for transferring to chair without AD    Ambulation/Gait Ambulation/Gait assistance: Supervision, Contact guard assist Gait Distance (Feet): 40 Feet Assistive device: None Gait Pattern/deviations: Decreased step length - right, Decreased step length - left, Decreased stride length Gait velocity: decreased     General Gait Details: slow slightly labored movement without loss of balance or need for an AD, but limited mostly due to fatigue and HR increasing > 140 bpm  Stairs  Wheelchair Mobility     Tilt Bed    Modified Rankin (Stroke Patients Only)       Balance Overall balance assessment: Needs assistance Sitting-balance support: Feet supported, No upper extremity supported Sitting balance-Leahy Scale: Good Sitting balance - Comments: seated at EOB   Standing balance support: During functional activity, No  upper extremity supported Standing balance-Leahy Scale: Fair Standing balance comment: fair/good without AD                             Pertinent Vitals/Pain Pain Assessment Pain Assessment: No/denies pain    Home Living Family/patient expects to be discharged to:: Private residence Living Arrangements: Children Available Help at Discharge: Family;Available PRN/intermittently Type of Home: Mobile home Home Access: Stairs to enter Entrance Stairs-Rails: Right;Left;Can reach both Entrance Stairs-Number of Steps: 5   Home Layout: One level Home Equipment: None Additional Comments: 2L O2 at night and as needed during the day.    Prior Function Prior Level of Function : Needs assist       Physical Assist : Mobility (physical);ADLs (physical) Mobility (physical): Bed mobility;Transfers;Gait;Stairs ADLs (physical): IADLs Mobility Comments: Community ambulator without AD. ADLs Comments: Independent ADL; assist for cooking and goes with family to the store.     Extremity/Trunk Assessment   Upper Extremity Assessment Upper Extremity Assessment: Defer to OT evaluation    Lower Extremity Assessment Lower Extremity Assessment: Generalized weakness    Cervical / Trunk Assessment Cervical / Trunk Assessment: Normal  Communication   Communication Communication: No apparent difficulties    Cognition Arousal: Alert Behavior During Therapy: WFL for tasks assessed/performed   PT - Cognitive impairments: No apparent impairments                         Following commands: Intact       Cueing Cueing Techniques: Verbal cues     General Comments General comments (skin integrity, edema, etc.): HR increased to 150 BPM at one point during ambulation.    Exercises     Assessment/Plan    PT Assessment Patient needs continued PT services  PT Problem List Decreased strength;Decreased activity tolerance;Decreased balance;Decreased mobility       PT  Treatment Interventions DME instruction;Gait training;Stair training;Functional mobility training;Therapeutic activities;Therapeutic exercise;Balance training;Patient/family education    PT Goals (Current goals can be found in the Care Plan section)  Acute Rehab PT Goals Patient Stated Goal: return home with family to assist PT Goal Formulation: With patient/family Time For Goal Achievement: 05/30/24 Potential to Achieve Goals: Good    Frequency Min 2X/week     Co-evaluation PT/OT/SLP Co-Evaluation/Treatment: Yes Reason for Co-Treatment: To address functional/ADL transfers PT goals addressed during session: Mobility/safety with mobility;Balance OT goals addressed during session: ADL's and self-care       AM-PAC PT 6 Clicks Mobility  Outcome Measure Help needed turning from your back to your side while in a flat bed without using bedrails?: None Help needed moving from lying on your back to sitting on the side of a flat bed without using bedrails?: None Help needed moving to and from a bed to a chair (including a wheelchair)?: None Help needed standing up from a chair using your arms (e.g., wheelchair or bedside chair)?: None Help needed to walk in hospital room?: A Little Help needed climbing 3-5 steps with a railing? : A Little 6 Click Score: 22    End of Session   Activity Tolerance:  Patient tolerated treatment well;Patient limited by fatigue Patient left: in chair;with call bell/phone within reach Nurse Communication: Mobility status PT Visit Diagnosis: Unsteadiness on feet (R26.81);Other abnormalities of gait and mobility (R26.89);Muscle weakness (generalized) (M62.81)    Time: 0912-0928 PT Time Calculation (min) (ACUTE ONLY): 16 min   Charges:   PT Evaluation $PT Eval Low Complexity: 1 Low PT Treatments $Gait Training: 8-22 mins PT General Charges $$ ACUTE PT VISIT: 1 Visit         1:52 PM, 05/26/24 Lynwood Music, MPT Physical Therapist with  [facility] 336 [phone] office [phone] mobile phone

## 2024-05-26 NOTE — Evaluation (Signed)
 Occupational Therapy Evaluation Patient Details Name: Natasha [name] MRN: [id number] DOB: 1956/05/27 Today's Date: 05/26/2024   History of Present Illness   Natasha [name] is a 68 year old female with a history of hypertension, COPD, chronic respiratory failure on 2 L as needed, hyperlipidemia, anxiety presenting with 1 month history of coughing, shortness of breath, chest congestion.  The patient states that she has seen her PCP and initially was placed on Augmentin and a steroid taper.  She had some temporary relief.  Her shortness of breath came back.  She was placed on doxycycline and another steroid taper.  Although she had some relief, her overall shortness of breath continue to worsen with dyspnea on exertion and coughing.  She quit smoking 10 years ago after about a 20-pack-year history.  She denies any nausea, vomiting or diarrhea, abdominal pain, dysuria, hematuria.  She denies any chest pain.  She has a nonproductive cough.  She denies any hemoptysis.  She denies any worsening lower extremity edema orthopnea.  In the ED, the patient was afebrile and hemodynamically stable with oxygen  saturation 98% on 2 L.  WBC 3.8, hemoglobin 12.0, platelets 216.  Sodium 137, potassium 4.4, bicarbonate 23, serum creatinine 0.45.  EKG showed sinus tachycardia without concerning ST/T wave changes.  D-dimer 0.41.  BNP 31.  COVID-19 PCR is negative.  Chest x-ray was negative for infiltrates.  Showed hyperinflation.  The patient was given DuoNebs and Solu-Medrol .  She was admitted for further evaluation and treatment of her respiratory failure. (per MD)     Clinical Impressions Pt agreeable to OT and PT co-evaluation. Pt appears to be near baseline function for ADL's and functional mobility. No assist needed for bed mobility; lower body dressing, or transfers without AD. Pt's HR did increase to ~150BPM during ambulation. Pt left in the chair with family present and call bell within reach. Pt will benefit from  continued OT in the hospital to increase strength, balance, and endurance for safe ADL's.                Functional Status Assessment   Patient has not had a recent decline in their functional status     Equipment Recommendations   None recommended by OT             Precautions/Restrictions   Precautions Precautions: Fall Recall of Precautions/Restrictions: Intact Restrictions Weight Bearing Restrictions Per Provider Order: No     Mobility Bed Mobility Overal bed mobility: Independent                  Transfers Overall transfer level: Independent                 General transfer comment: EOB to chair and ambulation in the room without AD.      Balance Overall balance assessment: Mild deficits observed, not formally tested                                         ADL either performed or assessed with clinical judgement   ADL Overall ADL's : Independent                                             Vision Baseline Vision/History:  (prior cataracts surgery) Ability to See in Adequate  Light: 0 Adequate Patient Visual Report: No change from baseline Vision Assessment?: No apparent visual deficits     Perception Perception: Not tested       Praxis Praxis: Not tested       Pertinent Vitals/Pain Pain Assessment Pain Assessment: No/denies pain     Extremity/Trunk Assessment Upper Extremity Assessment Upper Extremity Assessment: Overall WFL for tasks assessed   Lower Extremity Assessment Lower Extremity Assessment: Defer to PT evaluation   Cervical / Trunk Assessment Cervical / Trunk Assessment: Normal   Communication Communication Communication: No apparent difficulties   Cognition Arousal: Alert Behavior During Therapy: WFL for tasks assessed/performed Cognition: No apparent impairments                               Following commands: Intact       Cueing  General  Comments   Cueing Techniques: Verbal cues  HR increased to 150 BPM at one point during ambulation.              Home Living Family/patient expects to be discharged to:: Private residence Living Arrangements: Children Available Help at Discharge: Family;Available PRN/intermittently (Possibly 24/7 if needed.) Type of Home: Mobile home Home Access: Stairs to enter Entrance Stairs-Number of Steps: 5 Entrance Stairs-Rails: Right;Left;Can reach both Home Layout: One level     Bathroom Shower/Tub: Chief Strategy Officer: Standard Bathroom Accessibility: Yes How Accessible: Accessible via walker Home Equipment: None   Additional Comments: 2L O2 at night and as needed during the day.      Prior Functioning/Environment Prior Level of Function : Needs assist       Physical Assist : ADLs (physical)   ADLs (physical): IADLs Mobility Comments: Community ambulator without AD. ADLs Comments: Independent ADL; assist for cooking and goes with family to the store.                            Co-evaluation PT/OT/SLP Co-Evaluation/Treatment: Yes Reason for Co-Treatment: To address functional/ADL transfers   OT goals addressed during session: ADL's and self-care                       End of Session    Activity Tolerance: Patient tolerated treatment well Patient left: in chair;with call bell/phone within reach;with family/visitor present  OT Visit Diagnosis: Unsteadiness on feet (R26.81);Other abnormalities of gait and mobility (R26.89);Muscle weakness (generalized) (M62.81)                Time: 9086-9070 OT Time Calculation (min): 16 min Charges:  OT General Charges $OT Visit: 1 Visit OT Evaluation $OT Eval Low Complexity: 1 Low  Django Nguyen OT, MOT  Jayson Person 05/26/2024, 10:30 AM

## 2024-05-27 DIAGNOSIS — J9601 Acute respiratory failure with hypoxia: Secondary | ICD-10-CM | POA: Diagnosis not present

## 2024-05-27 LAB — BASIC METABOLIC PANEL WITH GFR
Anion gap: 7 (ref 5–15)
BUN: 25 mg/dL — ABNORMAL HIGH (ref 8–23)
CO2: 28 mmol/L (ref 22–32)
Calcium: 8.2 mg/dL — ABNORMAL LOW (ref 8.9–10.3)
Chloride: 102 mmol/L (ref 98–111)
Creatinine, Ser: 0.39 mg/dL — ABNORMAL LOW (ref 0.44–1.00)
GFR, Estimated: >60 mL/min (ref >60)
Glucose, Bld: 77 mg/dL (ref 70–99)
Potassium: 3.3 mmol/L — ABNORMAL LOW (ref 3.5–5.1)
Sodium: 137 mmol/L (ref 135–145)

## 2024-05-27 LAB — CBC
HCT: 35.8 % — ABNORMAL LOW (ref 36.0–46.0)
Hemoglobin: 11.3 g/dL — ABNORMAL LOW (ref 12.0–15.0)
MCH: 31 pg (ref 26.0–34.0)
MCHC: 31.6 g/dL (ref 30.0–36.0)
MCV: 98.1 fL (ref 80.0–100.0)
Platelets: 287 K/uL (ref 150–400)
RBC: 3.65 MIL/uL — ABNORMAL LOW (ref 3.87–5.11)
RDW: 13.9 % (ref 11.5–15.5)
WBC: 6.2 K/uL (ref 4.0–10.5)
nRBC: 0 % (ref 0.0–0.2)

## 2024-05-27 LAB — MAGNESIUM: Magnesium: 2.3 mg/dL (ref 1.7–2.4)

## 2024-05-27 MED ORDER — POTASSIUM CHLORIDE CRYS ER 20 MEQ PO TBCR
40.0000 meq | EXTENDED_RELEASE_TABLET | Freq: Two times a day (BID) | ORAL | Status: DC
  Administered 2024-05-27: 40 meq via ORAL
  Filled 2024-05-27: qty 2

## 2024-05-27 MED ORDER — MULTI-VITAMIN/MINERALS PO TABS: 1.0000 | ORAL_TABLET | Freq: Every day | ORAL | 2 refills | Status: AC

## 2024-05-27 MED ORDER — DM-GUAIFENESIN ER 30-600 MG PO TB12: 1.0000 | ORAL_TABLET | Freq: Two times a day (BID) | ORAL | 0 refills | Status: AC

## 2024-05-27 NOTE — Discharge Summary (Signed)
 Physician Discharge Summary  Natasha [name] FMW:[id number] DOB: 1956-05-30 DOA: 05/22/2024  PCP: Shona Arthea DASEN, DO  Admit date: 05/22/2024  Discharge date: 05/27/2024  Admitted From:Home  Disposition:  Home  Recommendations for Outpatient Follow-up:  Follow up with PCP in 1-2 weeks Follow-up with pulmonology outpatient with referral sent Continue on home breathing treatments as needed for shortness of breath or wheezing with no further need for steroids as this appeared to be contributing to her tachycardia Continue other home medications as prior  Home Health: None  Equipment/Devices: Has home 2 L nasal cannula oxygen   Discharge Condition:Stable  CODE STATUS: Full  Diet recommendation: Heart Healthy  Brief/Interim Summary: 68 year old female with a history of hypertension, COPD, chronic respiratory failure on 2 L as needed, hyperlipidemia, anxiety presenting with 1 month history of coughing, shortness of breath, chest congestion. She was admitted for acute on chronic hypoxemic respiratory failure secondary to acute COPD exacerbation and was started on IV steroids as well as breathing treatments. She developed palpitations on 9/23 and was thought to be in atrial tachycardia and was started on IV Cardizem  drip, however it appears that she has remained in sinus tachycardia likely as a result of her ongoing treatment of her respiratory issues.  She is overall much improved and appears to have some tachycardia with ambulation related to deconditioning, but is otherwise doing well and stable on her home 2 L nasal cannula oxygen .  She has been evaluated by PT/OT with no home recommendations noted.  No further need for steroids noted and she will continue to use home breathing treatments as needed.  Additionally, referral has been sent to pulmonology outpatient for follow-up.  Discharge Diagnoses:  Principal Problem:   Acute respiratory failure with hypoxia (HCC) Active Problems:   COPD  exacerbation (HCC)   Acute on chronic respiratory failure with hypoxia (HCC)   Mixed hyperlipidemia   Multifocal atrial tachycardia  Principal discharge diagnosis: Acute on chronic hypoxemic respiratory failure secondary to acute COPD exacerbation.  Discharge Instructions  Discharge Instructions     Diet - low sodium heart healthy   Complete by: As directed    Increase activity slowly   Complete by: As directed    Pulmonary Visit   Complete by: As directed    copd   Reason for referral: Other Pulmonary      Allergies as of 05/27/2024   No Known Allergies      Medication List     TAKE these medications    albuterol  108 (90 Base) MCG/ACT inhaler Commonly known as: VENTOLIN  HFA Inhale 1 puff into the lungs every 4 (four) hours as needed for wheezing or shortness of breath.   albuterol  (2.5 MG/3ML) 0.083% nebulizer solution Commonly known as: PROVENTIL  Take 3 mLs (2.5 mg total) by nebulization every 6 (six) hours as needed for wheezing or shortness of breath.   alendronate 70 MG/75ML solution Commonly known as: FOSAMAX Take 70 mg by mouth every 7 (seven) days. Take with a full glass of water  on an empty stomach.   ALPRAZolam  1 MG tablet Commonly known as: XANAX  Take 1 mg by mouth every 6 (six) hours as needed for anxiety.   dextromethorphan -guaiFENesin  30-600 MG 12hr tablet Commonly known as: MUCINEX  DM Take 1 tablet by mouth 2 (two) times daily for 10 days.   ipratropium 0.02 % nebulizer solution Commonly known as: ATROVENT  Take 2.5 mLs (0.5 mg total) by nebulization every 6 (six) hours as needed for wheezing or shortness of breath.  rosuvastatin  10 MG tablet Commonly known as: CRESTOR  Take 10 mg by mouth daily.   sertraline  25 MG tablet Commonly known as: ZOLOFT  Take 25 mg by mouth daily.   Trelegy Ellipta 100-62.5-25 MCG/ACT Aepb Generic drug: Fluticasone-Umeclidin-Vilant Inhale 100 mg into the lungs daily.        Follow-up Information     Shona Hussar T, DO. Schedule an appointment as soon as possible for a visit in 1 week(s).   Specialty: Family Medicine        [facility]. Schedule an appointment as soon as possible for a visit.   Specialty: Pulmonology Contact information: [address] [place]  [address] [phone] Additional information: [address]  [address]  [place], [place] [address]        [facility]. Schedule an appointment as soon as possible for a visit.   Specialty: Pulmonology Why: If appointment cannot be obtained in [place], use this group Contact information: [address] [employer] [address] [place]  [address] [phone] Additional information: [address]  [address]  [place], [place] [address]               No Known Allergies  Consultations: None   Procedures/Studies: ECHOCARDIOGRAM COMPLETE Result Date: 05/24/2024    ECHOCARDIOGRAM REPORT   Patient Name:   Natasha [name] [facility] Date of Exam: 05/24/2024 Medical Rec #:  [id number]      Height:       61.0 in Accession #:    [id number]     Weight:       81.1 lb Date of Birth:  [birth year]      BSA:          1.287 m Patient Age:    68 years       BP:           143/66 mmHg Patient Gender: F              HR:           100 bpm. Exam Location:  [facility] Procedure: 2D Echo, Cardiac Doppler, Color Doppler and Intracardiac            Opacification Agent (Both Spectral and Color Flow Doppler were            utilized during procedure). Indications:    Atrial tachycardia  History:        Patient has prior history of Echocardiogram examinations, most                 recent 12/19/2013. COPD; Risk Factors:Hypertension and                 Dyslipidemia.  Sonographer:    Aida Pizza RCS Referring Phys: [phone] DAVID TAT  Sonographer Comments: Technically difficult study due to poor echo windows. IMPRESSIONS  1. Left ventricular ejection fraction, by estimation, is 65 to 70%.  The left ventricle has normal function. The left ventricle has no regional wall motion abnormalities. Left ventricular diastolic parameters are indeterminate.  2. Right ventricular systolic function is normal. The right ventricular size is normal. Tricuspid regurgitation signal is inadequate for assessing PA pressure.  3. The mitral valve is degenerative. Trivial mitral valve regurgitation.  4. The aortic valve is tricuspid. There is mild calcification of the aortic valve. Aortic valve regurgitation is not visualized.  5. The inferior vena cava is normal in size with greater than 50% respiratory variability, suggesting  right atrial pressure of 3 mmHg. Comparison(s): Prior images unable to be directly viewed. FINDINGS  Left Ventricle: Left ventricular ejection fraction, by estimation, is 65 to 70%. The left ventricle has normal function. The left ventricle has no regional wall motion abnormalities. Definity  contrast agent was given IV to delineate the left ventricular  endocardial borders. The left ventricular internal cavity size was normal in size. There is no left ventricular hypertrophy. Left ventricular diastolic parameters are indeterminate. Right Ventricle: The right ventricular size is normal. No increase in right ventricular wall thickness. Right ventricular systolic function is normal. Tricuspid regurgitation signal is inadequate for assessing PA pressure. Left Atrium: Left atrial size was normal in size. Right Atrium: Right atrial size was normal in size. Pericardium: There is no evidence of pericardial effusion. Mitral Valve: The mitral valve is degenerative in appearance. There is mild calcification of the mitral valve leaflet(s). Trivial mitral valve regurgitation. Tricuspid Valve: The tricuspid valve is grossly normal. Tricuspid valve regurgitation is trivial. Aortic Valve: The aortic valve is tricuspid. There is mild calcification of the aortic valve. There is mild aortic valve annular calcification.  Aortic valve regurgitation is not visualized. Pulmonic Valve: The pulmonic valve was grossly normal. Pulmonic valve regurgitation is trivial. Aorta: The aortic root is normal in size and structure. Venous: The inferior vena cava is normal in size with greater than 50% respiratory variability, suggesting right atrial pressure of 3 mmHg. IAS/Shunts: No atrial level shunt detected by color flow Doppler. Additional Comments: 3D was performed not requiring image post processing on an independent workstation and was indeterminate.  LEFT VENTRICLE PLAX 2D LVIDd:         3.30 cm   Diastology LVIDs:         2.20 cm   LV e' medial:    10.60 cm/s LV PW:         0.90 cm   LV E/e' medial:  10.3 LV IVS:        0.80 cm   LV e' lateral:   10.80 cm/s LVOT diam:     1.40 cm   LV E/e' lateral: 10.1 LV SV:         34 LV SV Index:   26 LVOT Area:     1.54 cm  RIGHT VENTRICLE RV S prime:     13.70 cm/s TAPSE (M-mode): 2.1 cm LEFT ATRIUM           Index        RIGHT ATRIUM          Index LA diam:      2.10 cm 1.63 cm/m   RA Area:     7.85 cm LA Vol (A2C): 15.4 ml 11.93 ml/m  RA Volume:   13.10 ml 10.18 ml/m LA Vol (A4C): 22.2 ml 17.25 ml/m  AORTIC VALVE LVOT Vmax:   117.00 cm/s LVOT Vmean:  65.300 cm/s LVOT VTI:    0.218 m  AORTA Ao Root diam: 2.50 cm MITRAL VALVE MV Area (PHT): 4.21 cm     SHUNTS MV Decel Time: 180 msec     Systemic VTI:  0.22 m MV E velocity: 109.00 cm/s  Systemic Diam: 1.40 cm MV A velocity: 77.60 cm/s MV E/A ratio:  1.40 Jayson Sierras MD Electronically signed by Jayson Sierras MD Signature Date/Time: 05/24/2024/4:36:06 PM    Final    DG Chest 2 View Result Date: 05/22/2024 CLINICAL DATA:  Dyspnea EXAM: CHEST - 2 VIEW COMPARISON:  12/18/2013 chest radiograph. FINDINGS: Stable cardiomediastinal silhouette  with normal heart size. No pneumothorax. No pleural effusion. Hyperinflated lungs. No pulmonary edema. No acute consolidative airspace disease. Similar minimal curvilinear scarring at the right costophrenic  angle. Lower thoracic vertebral compression fracture is new from 2015. IMPRESSION: 1. Hyperinflated lungs, suggesting COPD. No acute cardiopulmonary disease. 2. Lower thoracic vertebral compression fracture, new from 45, age indeterminate. Electronically Signed   By: Selinda DELENA Blue M.D.   On: 05/22/2024 09:40     Discharge Exam: Vitals:   05/27/24 0700 05/27/24 0749  BP: (!) 162/64   Pulse: (!) 116   Resp: (!) 23   Temp:  (!) 97.5 F (36.4 C)  SpO2: 97%    Vitals:   05/27/24 0500 05/27/24 0600 05/27/24 0700 05/27/24 0749  BP: (!) 171/70 (!) 125/46 (!) 162/64   Pulse: 83 86 (!) 116   Resp: 16 19 (!) 23   Temp:    (!) 97.5 F (36.4 C)  TempSrc:    Oral  SpO2: 99% 99% 97%   Weight:      Height:        General: Pt is alert, awake, not in acute distress Cardiovascular: RRR, S1/S2 +, no rubs, no gallops Respiratory: CTA bilaterally, no wheezing, no rhonchi, 2 L nasal cannula Abdominal: Soft, NT, ND, bowel sounds + Extremities: no edema, no cyanosis    The results of significant diagnostics from this hospitalization (including imaging, microbiology, ancillary and laboratory) are listed below for reference.     Microbiology: Recent Results (from the past 240 hours)  Resp panel by RT-PCR (RSV, Flu A&B, Covid) Anterior Nasal Swab     Status: None   Collection Time: 05/22/24  8:29 AM   Specimen: Anterior Nasal Swab  Result Value Ref Range Status   SARS Coronavirus 2 by RT PCR NEGATIVE NEGATIVE Final    Comment: (NOTE) SARS-CoV-2 target nucleic acids are NOT DETECTED.  The SARS-CoV-2 RNA is generally detectable in upper respiratory specimens during the acute phase of infection. The lowest concentration of SARS-CoV-2 viral copies this assay can detect is 138 copies/mL. A negative result does not preclude SARS-Cov-2 infection and should not be used as the sole basis for treatment or other patient management decisions. A negative result may occur with  improper specimen  collection/handling, submission of specimen other than nasopharyngeal swab, presence of viral mutation(s) within the areas targeted by this assay, and inadequate number of viral copies(<138 copies/mL). A negative result must be combined with clinical observations, patient history, and epidemiological information. The expected result is Negative.  Fact Sheet for Patients:  BloggerCourse.com  Fact Sheet for Healthcare Providers:  SeriousBroker.it  This test is no t yet approved or cleared by the [place]  FDA and  has been authorized for detection and/or diagnosis of SARS-CoV-2 by FDA under an Emergency Use Authorization (EUA). This EUA will remain  in effect (meaning this test can be used) for the duration of the COVID-19 declaration under Section 564(b)(1) of the Act, 21 U.S.C.section 360bbb-3(b)(1), unless the authorization is terminated  or revoked sooner.       Influenza A by PCR NEGATIVE NEGATIVE Final   Influenza B by PCR NEGATIVE NEGATIVE Final    Comment: (NOTE) The Xpert Xpress SARS-CoV-2/FLU/RSV plus assay is intended as an aid in the diagnosis of influenza from Nasopharyngeal swab specimens and should not be used as a sole basis for treatment. Nasal washings and aspirates are unacceptable for Xpert Xpress SARS-CoV-2/FLU/RSV testing.  Fact Sheet for Patients: BloggerCourse.com  Fact Sheet for Healthcare Providers: SeriousBroker.it  This test is not yet approved or cleared by the [place]  FDA and has been authorized for detection and/or diagnosis of SARS-CoV-2 by FDA under an Emergency Use Authorization (EUA). This EUA will remain in effect (meaning this test can be used) for the duration of the COVID-19 declaration under Section 564(b)(1) of the Act, 21 U.S.C. section 360bbb-3(b)(1), unless the authorization is terminated or revoked.     Resp Syncytial  Virus by PCR NEGATIVE NEGATIVE Final    Comment: (NOTE) Fact Sheet for Patients: BloggerCourse.com  Fact Sheet for Healthcare Providers: SeriousBroker.it  This test is not yet approved or cleared by the [place]  FDA and has been authorized for detection and/or diagnosis of SARS-CoV-2 by FDA under an Emergency Use Authorization (EUA). This EUA will remain in effect (meaning this test can be used) for the duration of the COVID-19 declaration under Section 564(b)(1) of the Act, 21 U.S.C. section 360bbb-3(b)(1), unless the authorization is terminated or revoked.  Performed at [facility], [address], [place], [place] [address]   Respiratory (~20 pathogens) panel by PCR     Status: None   Collection Time: 05/22/24 12:50 PM   Specimen: Nasopharyngeal Swab; Respiratory  Result Value Ref Range Status   Adenovirus NOT DETECTED NOT DETECTED Final   Coronavirus 229E NOT DETECTED NOT DETECTED Final    Comment: (NOTE) The Coronavirus on the Respiratory Panel, DOES NOT test for the novel  Coronavirus (2019 nCoV)    Coronavirus HKU1 NOT DETECTED NOT DETECTED Final   Coronavirus NL63 NOT DETECTED NOT DETECTED Final   Coronavirus OC43 NOT DETECTED NOT DETECTED Final   Metapneumovirus NOT DETECTED NOT DETECTED Final   Rhinovirus / Enterovirus NOT DETECTED NOT DETECTED Final   Influenza A NOT DETECTED NOT DETECTED Final   Influenza B NOT DETECTED NOT DETECTED Final   Parainfluenza Virus 1 NOT DETECTED NOT DETECTED Final   Parainfluenza Virus 2 NOT DETECTED NOT DETECTED Final   Parainfluenza Virus 3 NOT DETECTED NOT DETECTED Final   Parainfluenza Virus 4 NOT DETECTED NOT DETECTED Final   Respiratory Syncytial Virus NOT DETECTED NOT DETECTED Final   Bordetella pertussis NOT DETECTED NOT DETECTED Final   Bordetella Parapertussis NOT DETECTED NOT DETECTED Final   Chlamydophila pneumoniae NOT DETECTED NOT DETECTED Final   Mycoplasma  pneumoniae NOT DETECTED NOT DETECTED Final    Comment: Performed at [facility], [address], [place], [place] [address]  MRSA Next Gen by PCR, Nasal     Status: None   Collection Time: 05/23/24  7:29 PM   Specimen: Nasal Mucosa; Nasal Swab  Result Value Ref Range Status   MRSA by PCR Next Gen NOT DETECTED NOT DETECTED Final    Comment: (NOTE) The GeneXpert MRSA Assay (FDA approved for NASAL specimens only), is one component of a comprehensive MRSA colonization surveillance program. It is not intended to diagnose MRSA infection nor to guide or monitor treatment for MRSA infections. Test performance is not FDA approved in patients less than 47 years old. Performed at [facility], [address], [place], [place] [address]      Labs: BNP (last 3 results) Recent Labs    05/22/24 0826  BNP 31.0   Basic Metabolic Panel: Recent Labs  Lab 05/22/24 0826 05/23/24 0436 05/24/24 0446 05/25/24 0258 05/27/24 0429  NA 137 142 143 139 137  K 4.4 3.9 4.1 3.6 3.3*  CL 102 104 103 103 102  CO2 23 28 28 28 28   GLUCOSE 85 140* 125*  115* 77  BUN 13 16 21 19  25*  CREATININE 0.45 0.38* 0.42* 0.38* 0.39*  CALCIUM  8.7* 9.3 9.2 8.7* 8.2*  MG  --  2.0 1.9 2.2 2.3   Liver Function Tests: No results for input(s): AST, ALT, ALKPHOS, BILITOT, PROT, ALBUMIN in the last 168 hours. No results for input(s): LIPASE, AMYLASE in the last 168 hours. No results for input(s): AMMONIA in the last 168 hours. CBC: Recent Labs  Lab 05/22/24 0950 05/24/24 0446 05/25/24 0258 05/27/24 0429  WBC 3.8* 13.5* 12.4* 6.2  NEUTROABS 2.8  --   --   --   HGB 12.0 10.6* 10.6* 11.3*  HCT 37.9 33.1* 33.6* 35.8*  MCV 100.5* 100.0 100.0 98.1  PLT 216 270 292 287   Cardiac Enzymes: No results for input(s): CKTOTAL, CKMB, CKMBINDEX, TROPONINI in the last 168 hours. BNP: Invalid input(s): POCBNP CBG: No results for input(s): GLUCAP in the last 168 hours. D-Dimer No results  for input(s): DDIMER in the last 72 hours. Hgb A1c No results for input(s): HGBA1C in the last 72 hours. Lipid Profile No results for input(s): CHOL, HDL, LDLCALC, TRIG, CHOLHDL, LDLDIRECT in the last 72 hours. Thyroid  function studies No results for input(s): TSH, T4TOTAL, T3FREE, THYROIDAB in the last 72 hours.  Invalid input(s): FREET3 Anemia work up No results for input(s): VITAMINB12, FOLATE, FERRITIN, TIBC, IRON, RETICCTPCT in the last 72 hours. Urinalysis No results found for: COLORURINE, APPEARANCEUR, LABSPEC, PHURINE, GLUCOSEU, HGBUR, BILIRUBINUR, KETONESUR, PROTEINUR, UROBILINOGEN, NITRITE, LEUKOCYTESUR Sepsis Labs Recent Labs  Lab 05/22/24 0950 05/24/24 0446 05/25/24 0258 05/27/24 0429  WBC 3.8* 13.5* 12.4* 6.2   Microbiology Recent Results (from the past 240 hours)  Resp panel by RT-PCR (RSV, Flu A&B, Covid) Anterior Nasal Swab     Status: None   Collection Time: 05/22/24  8:29 AM   Specimen: Anterior Nasal Swab  Result Value Ref Range Status   SARS Coronavirus 2 by RT PCR NEGATIVE NEGATIVE Final    Comment: (NOTE) SARS-CoV-2 target nucleic acids are NOT DETECTED.  The SARS-CoV-2 RNA is generally detectable in upper respiratory specimens during the acute phase of infection. The lowest concentration of SARS-CoV-2 viral copies this assay can detect is 138 copies/mL. A negative result does not preclude SARS-Cov-2 infection and should not be used as the sole basis for treatment or other patient management decisions. A negative result may occur with  improper specimen collection/handling, submission of specimen other than nasopharyngeal swab, presence of viral mutation(s) within the areas targeted by this assay, and inadequate number of viral copies(<138 copies/mL). A negative result must be combined with clinical observations, patient history, and epidemiological information. The expected result is  Negative.  Fact Sheet for Patients:  BloggerCourse.com  Fact Sheet for Healthcare Providers:  SeriousBroker.it  This test is no t yet approved or cleared by the [place]  FDA and  has been authorized for detection and/or diagnosis of SARS-CoV-2 by FDA under an Emergency Use Authorization (EUA). This EUA will remain  in effect (meaning this test can be used) for the duration of the COVID-19 declaration under Section 564(b)(1) of the Act, 21 U.S.C.section 360bbb-3(b)(1), unless the authorization is terminated  or revoked sooner.       Influenza A by PCR NEGATIVE NEGATIVE Final   Influenza B by PCR NEGATIVE NEGATIVE Final    Comment: (NOTE) The Xpert Xpress SARS-CoV-2/FLU/RSV plus assay is intended as an aid in the diagnosis of influenza from Nasopharyngeal swab specimens and should not be used as a sole basis  for treatment. Nasal washings and aspirates are unacceptable for Xpert Xpress SARS-CoV-2/FLU/RSV testing.  Fact Sheet for Patients: BloggerCourse.com  Fact Sheet for Healthcare Providers: SeriousBroker.it  This test is not yet approved or cleared by the [place]  FDA and has been authorized for detection and/or diagnosis of SARS-CoV-2 by FDA under an Emergency Use Authorization (EUA). This EUA will remain in effect (meaning this test can be used) for the duration of the COVID-19 declaration under Section 564(b)(1) of the Act, 21 U.S.C. section 360bbb-3(b)(1), unless the authorization is terminated or revoked.     Resp Syncytial Virus by PCR NEGATIVE NEGATIVE Final    Comment: (NOTE) Fact Sheet for Patients: BloggerCourse.com  Fact Sheet for Healthcare Providers: SeriousBroker.it  This test is not yet approved or cleared by the [place]  FDA and has been authorized for detection and/or diagnosis of  SARS-CoV-2 by FDA under an Emergency Use Authorization (EUA). This EUA will remain in effect (meaning this test can be used) for the duration of the COVID-19 declaration under Section 564(b)(1) of the Act, 21 U.S.C. section 360bbb-3(b)(1), unless the authorization is terminated or revoked.  Performed at [facility], [address], [place], [place] [address]   Respiratory (~20 pathogens) panel by PCR     Status: None   Collection Time: 05/22/24 12:50 PM   Specimen: Nasopharyngeal Swab; Respiratory  Result Value Ref Range Status   Adenovirus NOT DETECTED NOT DETECTED Final   Coronavirus 229E NOT DETECTED NOT DETECTED Final    Comment: (NOTE) The Coronavirus on the Respiratory Panel, DOES NOT test for the novel  Coronavirus (2019 nCoV)    Coronavirus HKU1 NOT DETECTED NOT DETECTED Final   Coronavirus NL63 NOT DETECTED NOT DETECTED Final   Coronavirus OC43 NOT DETECTED NOT DETECTED Final   Metapneumovirus NOT DETECTED NOT DETECTED Final   Rhinovirus / Enterovirus NOT DETECTED NOT DETECTED Final   Influenza A NOT DETECTED NOT DETECTED Final   Influenza B NOT DETECTED NOT DETECTED Final   Parainfluenza Virus 1 NOT DETECTED NOT DETECTED Final   Parainfluenza Virus 2 NOT DETECTED NOT DETECTED Final   Parainfluenza Virus 3 NOT DETECTED NOT DETECTED Final   Parainfluenza Virus 4 NOT DETECTED NOT DETECTED Final   Respiratory Syncytial Virus NOT DETECTED NOT DETECTED Final   Bordetella pertussis NOT DETECTED NOT DETECTED Final   Bordetella Parapertussis NOT DETECTED NOT DETECTED Final   Chlamydophila pneumoniae NOT DETECTED NOT DETECTED Final   Mycoplasma pneumoniae NOT DETECTED NOT DETECTED Final    Comment: Performed at [facility], [address], [place], [place] [address]  MRSA Next Gen by PCR, Nasal     Status: None   Collection Time: 05/23/24  7:29 PM   Specimen: Nasal Mucosa; Nasal Swab  Result Value Ref Range Status   MRSA by PCR Next Gen NOT DETECTED NOT DETECTED Final     Comment: (NOTE) The GeneXpert MRSA Assay (FDA approved for NASAL specimens only), is one component of a comprehensive MRSA colonization surveillance program. It is not intended to diagnose MRSA infection nor to guide or monitor treatment for MRSA infections. Test performance is not FDA approved in patients less than 31 years old. Performed at [facility], [address], [place], [place] [address]      Time coordinating discharge: 35 minutes  SIGNED:   Adron JONETTA Fairly, DO [facility] 05/27/2024, 8:11 AM  If 7PM-7AM, please contact night-coverage [email or web address]

## 2024-06-22 ENCOUNTER — Other Ambulatory Visit (HOSPITAL_COMMUNITY): Payer: Self-pay | Admitting: Internal Medicine

## 2024-06-22 DIAGNOSIS — M810 Age-related osteoporosis without current pathological fracture: Secondary | ICD-10-CM

## 2024-07-03 ENCOUNTER — Ambulatory Visit: Admitting: Internal Medicine

## 2024-07-03 ENCOUNTER — Encounter: Payer: Self-pay | Admitting: Internal Medicine

## 2024-07-03 VITALS — BP 147/59 | HR 76 | Ht 61.0 in | Wt 81.8 lb

## 2024-07-03 DIAGNOSIS — J9611 Chronic respiratory failure with hypoxia: Secondary | ICD-10-CM | POA: Diagnosis not present

## 2024-07-03 DIAGNOSIS — J449 Chronic obstructive pulmonary disease, unspecified: Secondary | ICD-10-CM | POA: Diagnosis not present

## 2024-07-03 DIAGNOSIS — Z87891 Personal history of nicotine dependence: Secondary | ICD-10-CM | POA: Diagnosis not present

## 2024-07-03 NOTE — Assessment & Plan Note (Signed)
 Started on 24h 02 at d/c 05/27/24 but had been using hs and prn since 2015  - 07/03/2024   Walked on 2lpm   x  3  lap(s) =  approx 450  ft  @ slow to mod  pace, stopped due to end of study with lowest 02 sats 95%   No need for 02 at rest and instructed on titrating to as much as 4lpm with exertion as she gets more active if needed for sats > 90%

## 2024-07-03 NOTE — Patient Instructions (Addendum)
 My office will be contacting you by phone for referral to lung cancer screening   [phone]) - if you don't hear back from my office within one week,  please call us  back or notify us  thru MyChart and we'll address it right away.    Please remember to go to the lab department   for your tests - we will call you with the results when they are available.  Make sure you check your oxygen  saturation  AT  your highest level of activity (not after you stop)   to be sure it stays over 90% and adjust  02 flow upward to maintain this level if needed but remember to turn it back to previous settings when you stop (to conserve your supply).       Follow up with PFTs same day next time both are available

## 2024-07-03 NOTE — Progress Notes (Unsigned)
 Natasha Natasha [name], female    DOB: 1956-04-29    MRN: [id number]   Brief patient profile:  63  yowf  12/[birth year]  referred to pulmonary clinic in [place]  07/03/2024 by [facility]  for copd eval    Pt not previously seen by PCCM service.    Admit date: 05/22/2024   Discharge date: 05/27/2024   Admitted From:Home   Disposition:  Home   Recommendations for Outpatient Follow-up:  Follow up with PCP in 1-2 weeks Follow-up with pulmonology outpatient with referral sent Continue on home breathing treatments as needed for shortness of breath or wheezing with no further need for steroids as this appeared to be contributing to her tachycardia Continue other home medications as prior   Equipment/Devices: Has home 2 L nasal cannula oxygen    Discharge Condition:Stable   CODE STATUS: Full   Diet recommendation: Heart Healthy   Brief/Interim Summary: 68 year old female with a history of hypertension, COPD, chronic respiratory failure on 2 L as needed, hyperlipidemia, anxiety presenting with 1 month history of coughing, shortness of breath, chest congestion. She was admitted for acute on chronic hypoxemic respiratory failure secondary to acute COPD exacerbation and was started on IV steroids as well as breathing treatments. She developed palpitations on 9/23 and was thought to be in atrial tachycardia and was started on IV Cardizem  drip, however it appears that she has remained in sinus tachycardia likely as a result of her ongoing treatment of her respiratory issues.  She is overall much improved and appears to have some tachycardia with ambulation related to deconditioning, but is otherwise doing well and stable on her home 2 L nasal cannula oxygen .  She has been evaluated by PT/OT with no home recommendations noted.  No further need for steroids noted and she will continue to use home breathing treatments as needed.  Additionally, referral has been sent to pulmonology outpatient for follow-up.   Discharge  Diagnoses:  Principal Problem:   Acute respiratory failure with hypoxia (HCC)   COPD exacerbation (HCC)   Acute on chronic respiratory failure with hypoxia (HCC)   Mixed hyperlipidemia   Multifocal atrial tachycardia   Principal discharge diagnosis: Acute on chronic hypoxemic respiratory failure secondary to acute COPD exacerbation.   Pulmonary Visit   Complete by: As directed      copd      History of Present Illness  07/03/2024  Pulmonary/ 1st office eval/ Natasha Natasha [name] / [facility]/ Trelegy 100    post hosp as above maint on 02 2lpm newly 24/7   Chief Complaint  Patient presents with   Shortness of Breath    Hsp for resp fail. 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    Also denies any obvious fluctuation of symptoms with weather or environmental changes or other aggravating or alleviating factors except as outlined above   No unusual exposure hx or h/o childhood pna/ asthma or knowledge of premature birth.  Current Allergies, Complete Past Medical History, Past Surgical History, Family History,  and Social History were reviewed in [employer] record.  ROS  The following are not active complaints unless bolded Hoarseness, sore throat, dysphagia, dental problems, itching, sneezing,  nasal congestion or discharge of excess mucus or purulent secretions, ear ache,   fever, chills, sweats, unintended wt loss or wt gain, classically pleuritic or exertional cp,  orthopnea pnd or arm/Natasha [name] swelling  or leg swelling, presyncope, palpitations,  abdominal pain, anorexia, nausea, vomiting, diarrhea  or change in bowel habits or change in bladder habits, change in stools or change in urine, dysuria, hematuria,  rash, arthralgias, visual complaints, headache, numbness, weakness or ataxia or problems with walking or coordination,  change in mood or  memory.            Outpatient Medications Prior to Visit  Medication Sig Dispense Refill   albuterol  (PROVENTIL  HFA;VENTOLIN  HFA) 108 (90 BASE) MCG/ACT inhaler Inhale 1 puff into the lungs every 4 (four) hours as needed for wheezing or shortness of breath. 1 Inhaler 1   albuterol  (PROVENTIL ) (2.5 MG/3ML) 0.083% nebulizer solution Take 3 mLs (2.5 mg total) by nebulization every 6 (six) hours as needed for wheezing or shortness of breath. 75 mL 12   alendronate (FOSAMAX) 70 MG/75ML solution Take 70 mg by mouth every 7 (seven) days. Take with a full glass of water  on an empty stomach.     ALPRAZolam  (XANAX ) 1 MG tablet Take 1 mg by mouth every 6 (six) hours as needed for anxiety.     Fluticasone-Umeclidin-Vilant (TRELEGY ELLIPTA) 100-62.5-25 MCG/ACT AEPB Inhale 100 mg into the lungs daily.     ipratropium (ATROVENT ) 0.02 % nebulizer solution Take 2.5 mLs (0.5 mg total) by nebulization every 6 (six) hours as needed for wheezing or shortness of breath. 75 mL 12   Multiple Vitamins-Minerals (MULTIVITAMIN WITH MINERALS) tablet Take 1 tablet by mouth daily. 120 tablet 2   rosuvastatin  (CRESTOR ) 10 MG tablet Take 10 mg by mouth daily.     sertraline  (ZOLOFT ) 25 MG tablet Take 25 mg by mouth daily.     No facility-administered medications prior to visit.    Past Medical History:  Diagnosis Date   Arthritis    osteoporosis   COPD (chronic obstructive pulmonary disease) (HCC)    HTN (hypertension)       Objective:     BP (!) 147/59   Pulse 76   Ht 5' 1 (1.549 m)   Wt 81 lb 12.8 oz (37.1 kg)   SpO2 95% Comment: 2 l poc  BMI 15.46 kg/m   SpO2: 95 % (2 l poc)  thin amb wf nad   - off 02  sats still 94% at rest    HEENT : Oropharynx  clear   Nasal turbinates nl    NECK :  without  apparent JVD/ palpable Nodes/TM    LUNGS: no acc muscle use,  Mild barrel  contour chest wall with bilateral  Distant bs s audible wheeze and  without cough on insp or exp maneuvers  and mild  Hyperresonant  to  percussion bilaterally     CV:  RRR  no s3 or murmur or increase in P2, and no edema   ABD:  soft and nontender   MS:  Nl gait/ ext warm without deformities Or obvious joint restrictions  calf tenderness, cyanosis or clubbing     SKIN: warm and dry without lesions    NEURO:  alert, approp, nl sensorium with  no motor or cerebellar deficits apparent.  I personally reviewed images and agree with radiology impression as follows:  CXR:   pa and lateral  05/22/24 1. Hyperinflated lungs, suggesting COPD. No acute cardiopulmonary disease. 2. Lower thoracic vertebral compression fracture, new from 34, age indeterminate.      Assessment   Assessment & Plan Former cigarette smoker Quit 2030   Low-dose CT lung cancer screening is recommended for patients who are 37-67 years of age with a 20+ pack-year history of smoking and who are currently smoking or quit <=15 years ago. No coughing up blood  No unintentional weight loss of > 15 pounds in the last 6 months - pt is eligible for scanning yearly until 2030 > referred  to [facility] program    Chronic respiratory failure with hypoxia (HCC) Started on 24h 02 at d/c 05/27/24 but had been using hs and prn since 2015  - 07/03/2024   Walked on 2lpm   x  3  lap(s) =  approx 450  ft  @ slow to mod  pace, stopped due to end of study with lowest 02 sats 95%   No need for 02 at rest and instructed on titrating to as much as 4lpm with exertion as she gets more active if needed for sats > 90%    COPD mixed type (HCC) Quit smoking 2015 - Alpha one AT phenotype  07/03/2024   Group E in terms of symptoms/risk so  laba/lama/ICS  therefore  appropriate rx at this point >>>  Trelegy 100  and approp SABA prn awaiting pfts ordered today   Re SABA :  I spent extra time with pt today reviewing appropriate use of albuterol  for prn use on exertion with the following points: 1) saba is for relief of sob that does not improve by walking a slower pace or resting but rather if the pt does not improve after trying this first. 2) If the pt is convinced, as many are, that saba helps recover from activity faster then it's easy to tell if this is the case by re-challenging : ie stop, take the inhaler, then p 5 minutes try the exact same activity (intensity of workload) that just caused the symptoms and see if they are substantially diminished or not after saba 3) if there is an activity that reproducibly causes the symptoms, try the saba 15 min before the activity on alternate days   If in fact the saba really does help, then fine to continue to use it prn but advised may need to look closer at the maintenance regimen being used to achieve better control of airways disease with exertion.    Each maintenance medication was reviewed in detail including emphasizing most importantly the difference between maintenance and prns and under what circumstances the prns are to be triggered using an action plan format where appropriate.  Total time for H and P, chart review, counseling, reviewing dpi/elipta/ 02/pulse ox  device(s) , directly observing portions of ambulatory 02 saturation study/ and generating customized AVS unique to this office visit / same day charting = 46 min with pt new to me                   AVS  Patient Instructions  My office will be contacting you by phone for referral to lung cancer screening   [phone]) - if you don't hear back from my office within one week,  please call us  back or notify us  thru MyChart and we'll address it right away.  Please remember to go to the lab department   for your tests - we will call you  with the results when they are available.  Make sure you check your oxygen  saturation  AT  your highest level of activity (not after you stop)   to be sure it stays over 90% and adjust  02 flow upward to maintain this level if needed but remember to turn it back to previous settings when you stop (to conserve your supply).       Follow up with PFTs same day next time both are available      Ozell America, MD 07/04/2024

## 2024-07-03 NOTE — Assessment & Plan Note (Signed)
 Quit 2030   Low-dose CT lung cancer screening is recommended for patients who are 69-69 years of age with a 20+ pack-year history of smoking and who are currently smoking or quit <=15 years ago. No coughing up blood  No unintentional weight loss of > 15 pounds in the last 6 months - pt is eligible for scanning yearly until 2030 > referred  to [facility] program

## 2024-07-04 ENCOUNTER — Ambulatory Visit (HOSPITAL_COMMUNITY)
Admission: RE | Admit: 2024-07-04 | Discharge: 2024-07-04 | Disposition: A | Discharge status: Home / Self Care | Source: Ambulatory Visit | Attending: Internal Medicine | Admitting: Internal Medicine

## 2024-07-04 ENCOUNTER — Telehealth: Payer: Self-pay | Admitting: Internal Medicine

## 2024-07-04 DIAGNOSIS — M810 Age-related osteoporosis without current pathological fracture: Secondary | ICD-10-CM | POA: Diagnosis present

## 2024-07-04 NOTE — Assessment & Plan Note (Addendum)
 Quit smoking 2015 - Alpha one AT phenotype  07/03/2024   Group E in terms of symptoms/risk so  laba/lama/ICS  therefore appropriate rx at this point >>>  Trelegy 100  and approp SABA prn awaiting pfts ordered today   Re SABA :  I spent extra time with pt today reviewing appropriate use of albuterol  for prn use on exertion with the following points: 1) saba is for relief of sob that does not improve by walking a slower pace or resting but rather if the pt does not improve after trying this first. 2) If the pt is convinced, as many are, that saba helps recover from activity faster then it's easy to tell if this is the case by re-challenging : ie stop, take the inhaler, then p 5 minutes try the exact same activity (intensity of workload) that just caused the symptoms and see if they are substantially diminished or not after saba 3) if there is an activity that reproducibly causes the symptoms, try the saba 15 min before the activity on alternate days   If in fact the saba really does help, then fine to continue to use it prn but advised may need to look closer at the maintenance regimen being used to achieve better control of airways disease with exertion.    Each maintenance medication was reviewed in detail including emphasizing most importantly the difference between maintenance and prns and under what circumstances the prns are to be triggered using an action plan format where appropriate.  Total time for H and P, chart review, counseling, reviewing dpi/elipta/ 02/pulse ox  device(s) , directly observing portions of ambulatory 02 saturation study/ and generating customized AVS unique to this office visit / same day charting = 46 min with pt new to me

## 2024-07-04 NOTE — Telephone Encounter (Signed)
 Spoke with patient regarding the Thursday 10/05/24 10:00 am PFT at [facility] time is 9:45 am--1st floor registration desk for check in--followup with Dr. Darlean is scheduled 10/05/24 at 11:30 am..  Will mail information to patient and she voiced her understanding

## 2024-07-07 ENCOUNTER — Ambulatory Visit: Payer: Self-pay | Admitting: Internal Medicine

## 2024-07-07 LAB — CBC WITH DIFFERENTIAL/PLATELET
Basophils Absolute: 0 x10E3/uL (ref 0.0–0.2)
Basos: 1 %
EOS (ABSOLUTE): 0.1 x10E3/uL (ref 0.0–0.4)
Eos: 1 %
Hematocrit: 37.6 % (ref 34.0–46.6)
Hemoglobin: 11.8 g/dL (ref 11.1–15.9)
Immature Grans (Abs): 0 x10E3/uL (ref 0.0–0.1)
Immature Granulocytes: 0 %
Lymphocytes Absolute: 1.1 x10E3/uL (ref 0.7–3.1)
Lymphs: 22 %
MCH: 31.6 pg (ref 26.6–33.0)
MCHC: 31.4 g/dL — ABNORMAL LOW (ref 31.5–35.7)
MCV: 101 fL — ABNORMAL HIGH (ref 79–97)
Monocytes Absolute: 0.4 x10E3/uL (ref 0.1–0.9)
Monocytes: 7 %
Neutrophils Absolute: 3.6 x10E3/uL (ref 1.4–7.0)
Neutrophils: 69 %
Platelets: 250 x10E3/uL (ref 150–450)
RBC: 3.73 x10E6/uL — ABNORMAL LOW (ref 3.77–5.28)
RDW: 13.1 % (ref 11.7–15.4)
WBC: 5.2 x10E3/uL (ref 3.4–10.8)

## 2024-07-07 LAB — ALPHA-1-ANTITRYPSIN PHENOTYP: A-1 Antitrypsin: 209 mg/dL — ABNORMAL HIGH (ref 101–187)

## 2024-07-07 NOTE — Progress Notes (Signed)
 Called and relayed results to pt - pt confirmed understanding

## 2024-07-11 ENCOUNTER — Other Ambulatory Visit (HOSPITAL_COMMUNITY): Payer: Self-pay | Admitting: Internal Medicine

## 2024-07-11 DIAGNOSIS — M810 Age-related osteoporosis without current pathological fracture: Secondary | ICD-10-CM | POA: Insufficient documentation

## 2024-07-12 ENCOUNTER — Telehealth: Payer: Self-pay

## 2024-07-12 NOTE — Telephone Encounter (Signed)
 Auth Submission: APPROVED Site of care: Site of care: [facility] Payer: [facility] Medicare Medication & CPT/J Code(s) submitted: Evenity (Romosozumab) [id number] Diagnosis Code:  Route of submission (phone, fax, portal): portal Phone # Fax # Auth type: Buy/Bill PB Units/visits requested: 210mg , q4weeks x 12doses Reference number: [id number] Approval from: 07/11/24 to 07/11/25

## 2024-07-18 ENCOUNTER — Encounter: Attending: Internal Medicine | Admitting: *Deleted

## 2024-07-18 VITALS — BP 153/83 | HR 121 | Temp 97.6°F | Resp 18

## 2024-07-18 DIAGNOSIS — M810 Age-related osteoporosis without current pathological fracture: Secondary | ICD-10-CM

## 2024-07-18 MED ORDER — ROMOSOZUMAB-AQQG 105 MG/1.17ML ~~LOC~~ SOSY
210.0000 mg | PREFILLED_SYRINGE | Freq: Once | SUBCUTANEOUS | Status: AC
  Administered 2024-07-18: 210 mg via SUBCUTANEOUS

## 2024-07-18 NOTE — Progress Notes (Signed)
 Diagnosis: Osteoporosis  Provider:  Shona Salvo MD  Procedure: Injection  Evenity (Romosozumab-aqqg), Dose: 210 mg, Site: subcutaneous, Number of injections: 2  Injection Site(s): Left upper quad. abdomen and Right upper quad. abdomen  Post Care: Observation period completed  Discharge: Condition: Good, Destination: Home . AVS Provided  Performed by:  Baldwin Darice Helling, RN

## 2024-08-17 ENCOUNTER — Ambulatory Visit: Attending: Internal Medicine

## 2024-08-17 VITALS — BP 121/70 | Temp 98.0°F | Resp 16

## 2024-08-17 DIAGNOSIS — M810 Age-related osteoporosis without current pathological fracture: Secondary | ICD-10-CM | POA: Insufficient documentation

## 2024-08-17 MED ORDER — ROMOSOZUMAB-AQQG 105 MG/1.17ML ~~LOC~~ SOSY
210.0000 mg | PREFILLED_SYRINGE | Freq: Once | SUBCUTANEOUS | Status: AC
  Administered 2024-08-17: 11:00:00 210 mg via SUBCUTANEOUS

## 2024-08-17 NOTE — Progress Notes (Signed)
    Diagnosis: Osteoporosis  Provider:  Shona Salvo MD  Procedure: Injection  Evenity  (Romosozumab -aqqg), Dose: 210 mg, Site: subcutaneous, Number of injections: 2  Injection Site(s): Left lower quad. abdomen and Right lower quad. abdomne  Post Care: Patient declined observation  Discharge: Condition: Good, Destination: Home . AVS Declined  Performed by:  Delon ONEIDA Officer, RN

## 2024-08-22 ENCOUNTER — Encounter: Payer: Self-pay | Admitting: Internal Medicine

## 2024-08-22 NOTE — Addendum Note (Signed)
 Addended by: DAYNE SHERRY RAMAN on: 08/22/2024 11:38 AM   Modules accepted: Orders

## 2024-08-28 ENCOUNTER — Telehealth: Payer: Self-pay | Admitting: Internal Medicine

## 2024-08-28 NOTE — Telephone Encounter (Signed)
 Copied from CRM #[id number]. Topic: Appointments - Scheduling Inquiry for Clinic >> Aug 28, 2024 12:07 PM Ismael A wrote: Reason for CRM: pt is scheduled for a before and after full pft 10/05/24 but she wants to reschedule due to insurance, she rescheduled the PFT follow up visit for 01/01/25 at 11:15am - the PFT will be performed at [facility] and informed patient PFT rescheduled to Tuesday 12/26/24 at 10:00 am at [facility] mail information to patient and she voiced her understanding

## 2024-09-11 ENCOUNTER — Other Ambulatory Visit (HOSPITAL_COMMUNITY): Payer: Self-pay | Admitting: Internal Medicine

## 2024-09-13 ENCOUNTER — Telehealth: Payer: Self-pay

## 2024-09-13 NOTE — Telephone Encounter (Signed)
 Auth Submission: APPROVED Site of care: Site of care: [facility] Payer: [facility] medicare Medication & CPT/J Code(s) submitted: Evenity  (Romosozumab ) [id number] Diagnosis Code:  Route of submission (phone, fax, portal): portal Phone # Fax # Auth type: Buy/Bill PB Units/visits requested: 210mg  q4weeks Reference number: [id number] Approval from: 09/08/24 to 09/08/25

## 2024-09-14 ENCOUNTER — Encounter: Attending: Internal Medicine | Admitting: *Deleted

## 2024-09-14 VITALS — BP 149/70 | HR 94 | Temp 97.9°F | Resp 16

## 2024-09-14 DIAGNOSIS — M810 Age-related osteoporosis without current pathological fracture: Secondary | ICD-10-CM | POA: Insufficient documentation

## 2024-09-14 MED ORDER — ROMOSOZUMAB-AQQG 105 MG/1.17ML ~~LOC~~ SOSY
210.0000 mg | PREFILLED_SYRINGE | Freq: Once | SUBCUTANEOUS | Status: AC
  Administered 2024-09-14: 210 mg via SUBCUTANEOUS

## 2024-09-14 NOTE — Progress Notes (Signed)
 Diagnosis: Osteoporosis  Provider:  Shona Salvo MD  Procedure: Injection  Evenity  (Romosozumab -aqqg), Dose: 210 mg, Site: subcutaneous, Number of injections: 2  Injection Site(s): Left lower quad. abdomen and Right lower quad. abdomne  Post Care: Observation period completed  Discharge: Condition: Good, Destination: Home . AVS Declined  Performed by:  Baldwin Darice Helling, RN

## 2024-10-05 ENCOUNTER — Ambulatory Visit: Admitting: Internal Medicine

## 2024-10-05 ENCOUNTER — Encounter (HOSPITAL_COMMUNITY)

## 2024-10-12 ENCOUNTER — Ambulatory Visit

## 2024-12-26 ENCOUNTER — Encounter (HOSPITAL_COMMUNITY)

## 2025-01-01 ENCOUNTER — Ambulatory Visit: Admitting: Internal Medicine
